# Patient Record
Sex: Female | Born: 1960 | Race: Black or African American | Hispanic: No | Marital: Single | Smoking: Never smoker
Health system: Southern US, Community
[De-identification: ages and names within clinical notes are randomized; demographics above are authoritative.]

## PROBLEM LIST (undated history)

## (undated) DIAGNOSIS — E119 Type 2 diabetes mellitus without complications: Secondary | ICD-10-CM

## (undated) DIAGNOSIS — E785 Hyperlipidemia, unspecified: Secondary | ICD-10-CM

## (undated) DIAGNOSIS — I10 Essential (primary) hypertension: Secondary | ICD-10-CM

## (undated) HISTORY — DX: Hyperlipidemia, unspecified: E78.5

## (undated) HISTORY — DX: Essential (primary) hypertension: I10

## (undated) HISTORY — PX: STOMACH SURGERY: SHX791

---

## 2004-11-15 ENCOUNTER — Emergency Department: Payer: Self-pay | Admitting: Unknown Physician Specialty

## 2006-08-17 ENCOUNTER — Emergency Department: Payer: Self-pay | Admitting: General Practice

## 2007-05-19 ENCOUNTER — Emergency Department: Payer: Self-pay | Admitting: Emergency Medicine

## 2008-05-12 ENCOUNTER — Emergency Department: Payer: Self-pay | Admitting: Emergency Medicine

## 2009-03-21 ENCOUNTER — Emergency Department: Payer: Self-pay | Admitting: Emergency Medicine

## 2009-09-08 ENCOUNTER — Emergency Department: Payer: Self-pay | Admitting: Emergency Medicine

## 2011-10-12 ENCOUNTER — Emergency Department: Payer: Self-pay | Admitting: Emergency Medicine

## 2016-03-27 ENCOUNTER — Other Ambulatory Visit: Payer: Self-pay | Admitting: Family Medicine

## 2016-03-27 DIAGNOSIS — Z01419 Encounter for gynecological examination (general) (routine) without abnormal findings: Secondary | ICD-10-CM

## 2016-06-08 ENCOUNTER — Ambulatory Visit: Payer: Self-pay | Attending: Family Medicine

## 2017-09-17 ENCOUNTER — Other Ambulatory Visit: Payer: Self-pay | Admitting: Internal Medicine

## 2017-09-17 DIAGNOSIS — E1169 Type 2 diabetes mellitus with other specified complication: Secondary | ICD-10-CM | POA: Insufficient documentation

## 2017-09-17 DIAGNOSIS — I152 Hypertension secondary to endocrine disorders: Secondary | ICD-10-CM | POA: Insufficient documentation

## 2017-09-17 DIAGNOSIS — I10 Essential (primary) hypertension: Secondary | ICD-10-CM | POA: Insufficient documentation

## 2017-09-17 DIAGNOSIS — E782 Mixed hyperlipidemia: Secondary | ICD-10-CM

## 2017-09-17 DIAGNOSIS — Z1239 Encounter for other screening for malignant neoplasm of breast: Secondary | ICD-10-CM

## 2018-08-17 ENCOUNTER — Encounter: Payer: Self-pay | Admitting: Nurse Practitioner

## 2018-08-17 DIAGNOSIS — J309 Allergic rhinitis, unspecified: Secondary | ICD-10-CM | POA: Insufficient documentation

## 2018-08-17 DIAGNOSIS — E119 Type 2 diabetes mellitus without complications: Secondary | ICD-10-CM | POA: Insufficient documentation

## 2018-08-17 DIAGNOSIS — R809 Proteinuria, unspecified: Secondary | ICD-10-CM | POA: Insufficient documentation

## 2018-08-18 ENCOUNTER — Ambulatory Visit (INDEPENDENT_AMBULATORY_CARE_PROVIDER_SITE_OTHER): Payer: BLUE CROSS/BLUE SHIELD | Admitting: Nurse Practitioner

## 2018-08-18 ENCOUNTER — Encounter: Payer: Self-pay | Admitting: Nurse Practitioner

## 2018-08-18 VITALS — BP 134/76 | HR 65 | Temp 98.1°F | Ht 64.2 in | Wt 179.0 lb

## 2018-08-18 DIAGNOSIS — E669 Obesity, unspecified: Secondary | ICD-10-CM | POA: Insufficient documentation

## 2018-08-18 DIAGNOSIS — E782 Mixed hyperlipidemia: Secondary | ICD-10-CM

## 2018-08-18 DIAGNOSIS — I10 Essential (primary) hypertension: Secondary | ICD-10-CM | POA: Diagnosis not present

## 2018-08-18 NOTE — Patient Instructions (Signed)
DASH Eating Plan  DASH stands for "Dietary Approaches to Stop Hypertension." The DASH eating plan is a healthy eating plan that has been shown to reduce high blood pressure (hypertension). It may also reduce your risk for type 2 diabetes, heart disease, and stroke. The DASH eating plan may also help with weight loss.  What are tips for following this plan?    General guidelines   Avoid eating more than 2,300 mg (milligrams) of salt (sodium) a day. If you have hypertension, you may need to reduce your sodium intake to 1,500 mg a day.   Limit alcohol intake to no more than 1 drink a day for nonpregnant women and 2 drinks a day for men. One drink equals 12 oz of beer, 5 oz of wine, or 1 oz of hard liquor.   Work with your health care provider to maintain a healthy body weight or to lose weight. Ask what an ideal weight is for you.   Get at least 30 minutes of exercise that causes your heart to beat faster (aerobic exercise) most days of the week. Activities may include walking, swimming, or biking.   Work with your health care provider or diet and nutrition specialist (dietitian) to adjust your eating plan to your individual calorie needs.  Reading food labels     Check food labels for the amount of sodium per serving. Choose foods with less than 5 percent of the Daily Value of sodium. Generally, foods with less than 300 mg of sodium per serving fit into this eating plan.   To find whole grains, look for the word "whole" as the first word in the ingredient list.  Shopping   Buy products labeled as "low-sodium" or "no salt added."   Buy fresh foods. Avoid canned foods and premade or frozen meals.  Cooking   Avoid adding salt when cooking. Use salt-free seasonings or herbs instead of table salt or sea salt. Check with your health care provider or pharmacist before using salt substitutes.   Do not fry foods. Cook foods using healthy methods such as baking, boiling, grilling, and broiling instead.   Cook with  heart-healthy oils, such as olive, canola, soybean, or sunflower oil.  Meal planning   Eat a balanced diet that includes:  ? 5 or more servings of fruits and vegetables each day. At each meal, try to fill half of your plate with fruits and vegetables.  ? Up to 6-8 servings of whole grains each day.  ? Less than 6 oz of lean meat, poultry, or fish each day. A 3-oz serving of meat is about the same size as a deck of cards. One egg equals 1 oz.  ? 2 servings of low-fat dairy each day.  ? A serving of nuts, seeds, or beans 5 times each week.  ? Heart-healthy fats. Healthy fats called Omega-3 fatty acids are found in foods such as flaxseeds and coldwater fish, like sardines, salmon, and mackerel.   Limit how much you eat of the following:  ? Canned or prepackaged foods.  ? Food that is high in trans fat, such as fried foods.  ? Food that is high in saturated fat, such as fatty meat.  ? Sweets, desserts, sugary drinks, and other foods with added sugar.  ? Full-fat dairy products.   Do not salt foods before eating.   Try to eat at least 2 vegetarian meals each week.   Eat more home-cooked food and less restaurant, buffet, and fast food.     When eating at a restaurant, ask that your food be prepared with less salt or no salt, if possible.  What foods are recommended?  The items listed may not be a complete list. Talk with your dietitian about what dietary choices are best for you.  Grains  Whole-grain or whole-wheat bread. Whole-grain or whole-wheat pasta. Brown rice. Oatmeal. Quinoa. Bulgur. Whole-grain and low-sodium cereals. Pita bread. Low-fat, low-sodium crackers. Whole-wheat flour tortillas.  Vegetables  Fresh or frozen vegetables (raw, steamed, roasted, or grilled). Low-sodium or reduced-sodium tomato and vegetable juice. Low-sodium or reduced-sodium tomato sauce and tomato paste. Low-sodium or reduced-sodium canned vegetables.  Fruits  All fresh, dried, or frozen fruit. Canned fruit in natural juice (without  added sugar).  Meat and other protein foods  Skinless chicken or turkey. Ground chicken or turkey. Pork with fat trimmed off. Fish and seafood. Egg whites. Dried beans, peas, or lentils. Unsalted nuts, nut butters, and seeds. Unsalted canned beans. Lean cuts of beef with fat trimmed off. Low-sodium, lean deli meat.  Dairy  Low-fat (1%) or fat-free (skim) milk. Fat-free, low-fat, or reduced-fat cheeses. Nonfat, low-sodium ricotta or cottage cheese. Low-fat or nonfat yogurt. Low-fat, low-sodium cheese.  Fats and oils  Soft margarine without trans fats. Vegetable oil. Low-fat, reduced-fat, or light mayonnaise and salad dressings (reduced-sodium). Canola, safflower, olive, soybean, and sunflower oils. Avocado.  Seasoning and other foods  Herbs. Spices. Seasoning mixes without salt. Unsalted popcorn and pretzels. Fat-free sweets.  What foods are not recommended?  The items listed may not be a complete list. Talk with your dietitian about what dietary choices are best for you.  Grains  Baked goods made with fat, such as croissants, muffins, or some breads. Dry pasta or rice meal packs.  Vegetables  Creamed or fried vegetables. Vegetables in a cheese sauce. Regular canned vegetables (not low-sodium or reduced-sodium). Regular canned tomato sauce and paste (not low-sodium or reduced-sodium). Regular tomato and vegetable juice (not low-sodium or reduced-sodium). Pickles. Olives.  Fruits  Canned fruit in a light or heavy syrup. Fried fruit. Fruit in cream or butter sauce.  Meat and other protein foods  Fatty cuts of meat. Ribs. Fried meat. Bacon. Sausage. Bologna and other processed lunch meats. Salami. Fatback. Hotdogs. Bratwurst. Salted nuts and seeds. Canned beans with added salt. Canned or smoked fish. Whole eggs or egg yolks. Chicken or turkey with skin.  Dairy  Whole or 2% milk, cream, and half-and-half. Whole or full-fat cream cheese. Whole-fat or sweetened yogurt. Full-fat cheese. Nondairy creamers. Whipped toppings.  Processed cheese and cheese spreads.  Fats and oils  Butter. Stick margarine. Lard. Shortening. Ghee. Bacon fat. Tropical oils, such as coconut, palm kernel, or palm oil.  Seasoning and other foods  Salted popcorn and pretzels. Onion salt, garlic salt, seasoned salt, table salt, and sea salt. Worcestershire sauce. Tartar sauce. Barbecue sauce. Teriyaki sauce. Soy sauce, including reduced-sodium. Steak sauce. Canned and packaged gravies. Fish sauce. Oyster sauce. Cocktail sauce. Horseradish that you find on the shelf. Ketchup. Mustard. Meat flavorings and tenderizers. Bouillon cubes. Hot sauce and Tabasco sauce. Premade or packaged marinades. Premade or packaged taco seasonings. Relishes. Regular salad dressings.  Where to find more information:   National Heart, Lung, and Blood Institute: www.nhlbi.nih.gov   American Heart Association: www.heart.org  Summary   The DASH eating plan is a healthy eating plan that has been shown to reduce high blood pressure (hypertension). It may also reduce your risk for type 2 diabetes, heart disease, and stroke.   With the   DASH eating plan, you should limit salt (sodium) intake to 2,300 mg a day. If you have hypertension, you may need to reduce your sodium intake to 1,500 mg a day.   When on the DASH eating plan, aim to eat more fresh fruits and vegetables, whole grains, lean proteins, low-fat dairy, and heart-healthy fats.   Work with your health care provider or diet and nutrition specialist (dietitian) to adjust your eating plan to your individual calorie needs.  This information is not intended to replace advice given to you by your health care provider. Make sure you discuss any questions you have with your health care provider.  Document Released: 07/09/2011 Document Revised: 07/13/2016 Document Reviewed: 07/13/2016  Elsevier Interactive Patient Education  2019 Elsevier Inc.

## 2018-08-18 NOTE — Assessment & Plan Note (Signed)
Provided information on DASH diet.  Recommend focus on diet and regular exercise with 30 minutes 5 days a week.

## 2018-08-18 NOTE — Assessment & Plan Note (Signed)
Chronic, ongoing.  Continue Crestor.  Labs at physical.

## 2018-08-18 NOTE — Progress Notes (Signed)
New Patient Office Visit  Subjective:  Patient ID: Julia Wood, female    DOB: 10-13-1960  Age: 58 y.o. MRN: [id number]  CC:  Chief Complaint  Patient presents with  . Establish Care  . Hypertension    pt states she would like to discuss getting on one blood pressure medication instead of 2     HPI Julia Wood presents for new patient visit to establish care.  Introduced to Designer, jewellery role and practice setting.  All questions answered.  HYPERTENSION / HYPERLIPIDEMIA Satisfied with current treatment? yes Duration of hypertension: chronic BP monitoring frequency: rarely BP range: 130/70's BP medication side effects: no Duration of hyperlipidemia: chronic Cholesterol medication side effects: no Cholesterol supplements: none Medication compliance: good compliance Aspirin: yes Recent stressors: no Recurrent headaches: no Visual changes: no Palpitations: no Dyspnea: no Chest pain: no Lower extremity edema: no Dizzy/lightheaded: no  Past Medical History:  Diagnosis Date  . Hyperlipidemia   . Hypertension     Past Surgical History:  Procedure Laterality Date  . STOMACH SURGERY      Family History  Problem Relation Age of Onset  . Hypertension Mother     Social History   Socioeconomic History  . Marital status: Single    Spouse name: Not on file  . Number of children: Not on file  . Years of education: Not on file  . Highest education level: Not on file  Occupational History  . Not on file  Social Needs  . Financial resource strain: Somewhat hard  . Food insecurity:    Worry: Sometimes true    Inability: Sometimes true  . Transportation needs:    Medical: No    Non-medical: No  Tobacco Use  . Smoking status: Never Smoker  . Smokeless tobacco: Never Used  Substance and Sexual Activity  . Alcohol use: Never    Frequency: Never  . Drug use: Never  . Sexual activity: Not Currently  Lifestyle  . Physical activity:    Days per week:  3 days    Minutes per session: 20 min  . Stress: Not at all  Relationships  . Social connections:    Talks on phone: More than three times a week    Gets together: More than three times a week    Attends religious service: 1 to 4 times per year    Active member of club or organization: No    Attends meetings of clubs or organizations: Never    Relationship status: Not on file  . Intimate partner violence:    Fear of current or ex partner: No    Emotionally abused: No    Physically abused: No    Forced sexual activity: No  Other Topics Concern  . Not on file  Social History Narrative  . Not on file    ROS Review of Systems  Constitutional: Negative for activity change, appetite change, diaphoresis, fatigue and fever.  Respiratory: Negative for cough, chest tightness and shortness of breath.   Cardiovascular: Negative for chest pain, palpitations and leg swelling.  Gastrointestinal: Negative for abdominal distention, abdominal pain, constipation, diarrhea, nausea and vomiting.  Endocrine: Negative for cold intolerance, heat intolerance, polydipsia, polyphagia and polyuria.  Neurological: Negative for dizziness, syncope, weakness, light-headedness, numbness and headaches.  Psychiatric/Behavioral: Negative.     Objective:   Today's Vitals: BP 134/76   Pulse 65   Temp 98.1 F (36.7 C) (Oral)   Ht 5' 4.2" (1.631 m)   Wt 179  lb (81.2 kg)   LMP  (LMP Unknown)   SpO2 100%   BMI 30.53 kg/m   Physical Exam Vitals signs and nursing note reviewed.  Constitutional:      General: She is awake.     Appearance: She is well-developed.  HENT:     Head: Normocephalic.     Right Ear: Hearing normal.     Left Ear: Hearing normal.     Nose: Nose normal.     Mouth/Throat:     Mouth: Mucous membranes are moist.  Eyes:     General: Lids are normal.        Right eye: No discharge.        Left eye: No discharge.     Conjunctiva/sclera: Conjunctivae normal.     Pupils: Pupils are  equal, round, and reactive to light.  Neck:     Musculoskeletal: Normal range of motion and neck supple.     Thyroid: No thyromegaly.     Vascular: No carotid bruit or JVD.  Cardiovascular:     Rate and Rhythm: Normal rate and regular rhythm.     Heart sounds: Normal heart sounds. No murmur. No gallop.   Pulmonary:     Effort: Pulmonary effort is normal.     Breath sounds: Normal breath sounds.  Abdominal:     General: Bowel sounds are normal.     Palpations: Abdomen is soft. There is no hepatomegaly or splenomegaly.  Musculoskeletal:     Right lower leg: No edema.     Left lower leg: No edema.  Lymphadenopathy:     Cervical: No cervical adenopathy.  Skin:    General: Skin is warm and dry.  Neurological:     Mental Status: She is alert and oriented to person, place, and time.  Psychiatric:        Attention and Perception: Attention normal.        Mood and Affect: Mood normal.        Behavior: Behavior normal. Behavior is cooperative.        Thought Content: Thought content normal.        Judgment: Judgment normal.     Assessment & Plan:   Problem List Items Addressed This Visit      Cardiovascular and Mediastinum   Essential hypertension - Primary    Chronic, stable with BP 134/76 today.  Patient wishes to decrease to one medication.  Recommended DASH diet and focus on exercise, if continued below goal medications will trial reduction of medications.  Labs at next visit.      Relevant Medications   amLODipine (NORVASC) 5 MG tablet   hydrochlorothiazide (HYDRODIURIL) 25 MG tablet   rosuvastatin (CRESTOR) 10 MG tablet   aspirin EC 81 MG tablet     Other   Hyperlipidemia, mixed    Chronic, ongoing.  Continue Crestor.  Labs at physical.      Relevant Medications   amLODipine (NORVASC) 5 MG tablet   hydrochlorothiazide (HYDRODIURIL) 25 MG tablet   rosuvastatin (CRESTOR) 10 MG tablet   aspirin EC 81 MG tablet   Obesity (BMI 30-39.9)    Provided information on DASH  diet.  Recommend focus on diet and regular exercise with 30 minutes 5 days a week.         Outpatient Encounter Medications as of 08/18/2018  Medication Sig  . amLODipine (NORVASC) 5 MG tablet Take 1 tablet by mouth daily.  Marland Kitchen aspirin EC 81 MG tablet Take 81 mg by mouth  daily.  . hydrochlorothiazide (HYDRODIURIL) 25 MG tablet Take 1 tablet by mouth daily.  . rosuvastatin (CRESTOR) 10 MG tablet TAKE 1 TABLET BY MOUTH ONCE DAILY   No facility-administered encounter medications on file as of 08/18/2018.     Follow-up: Return in about 4 weeks (around 09/15/2018) for Physical exam with pap.   Venita Lick, NP

## 2018-08-18 NOTE — Assessment & Plan Note (Signed)
Chronic, stable with BP 134/76 today.  Patient wishes to decrease to one medication.  Recommended DASH diet and focus on exercise, if continued below goal medications will trial reduction of medications.  Labs at next visit.

## 2018-09-02 ENCOUNTER — Other Ambulatory Visit: Payer: Self-pay | Admitting: Nurse Practitioner

## 2018-09-02 MED ORDER — ROSUVASTATIN CALCIUM 10 MG PO TABS: 10.0000 mg | ORAL_TABLET | Freq: Every day | ORAL | 1 refills | Status: DC

## 2018-09-02 NOTE — Telephone Encounter (Signed)
Copied from [place] [id number]. Topic: Quick Communication - Rx Refill/Question >> Sep 02, 2018  2:39 PM Windy Kalata wrote: Medication: rosuvastatin (CRESTOR) 10 MG tablet  Has the patient contacted their pharmacy? Yes.   (Agent: If no, request that the patient contact the pharmacy for the refill.) (Agent: If yes, when and what did the pharmacy advise?) Call office  Preferred Pharmacy (with phone number or street name): [facility] [place] [phone] (Phone) [phone] (Fax)    Agent: Please be advised that RX refills may take up to 3 business days. We ask that you follow-up with your pharmacy.

## 2018-09-02 NOTE — Telephone Encounter (Signed)
Refill for Crestor provided.

## 2018-09-02 NOTE — Telephone Encounter (Signed)
Requested medication (s) are due for refill today: not specified  Requested medication (s) are on the active medication list: yes    Last refill: 08/18/2018  Future visit scheduled    Yes    09/23/2018  Notes to clinic:  historical provider  Requested Prescriptions  Pending Prescriptions Disp Refills   rosuvastatin (CRESTOR) 10 MG tablet      Sig: Take 1 tablet (10 mg total) by mouth daily.     Cardiovascular:  Antilipid - Statins Failed - 09/02/2018  2:41 PM      Failed - Total Cholesterol in normal range and within 360 days    No results found for: CHOL, POCCHOL       Failed - LDL in normal range and within 360 days    No results found for: LDLCALC, LDLC, HIRISKLDL       Failed - HDL in normal range and within 360 days    No results found for: HDL       Failed - Triglycerides in normal range and within 360 days    No results found for: TRIG       Passed - Patient is not pregnant      Passed - Valid encounter within last 12 months    Recent Outpatient Visits          2 weeks ago Essential hypertension   [place], Barbaraann Faster, NP      Future Appointments            In 3 weeks Cannady, Barbaraann Faster, NP [facility]

## 2018-09-10 ENCOUNTER — Encounter: Payer: Self-pay | Admitting: Intensive Care

## 2018-09-10 ENCOUNTER — Emergency Department
Admission: EM | Admit: 2018-09-10 | Discharge: 2018-09-10 | Disposition: A | Discharge status: Home / Self Care | Payer: BLUE CROSS/BLUE SHIELD | Attending: Emergency Medicine | Admitting: Emergency Medicine

## 2018-09-10 ENCOUNTER — Other Ambulatory Visit: Payer: Self-pay

## 2018-09-10 DIAGNOSIS — J111 Influenza due to unidentified influenza virus with other respiratory manifestations: Secondary | ICD-10-CM | POA: Insufficient documentation

## 2018-09-10 DIAGNOSIS — I10 Essential (primary) hypertension: Secondary | ICD-10-CM | POA: Insufficient documentation

## 2018-09-10 DIAGNOSIS — Z79899 Other long term (current) drug therapy: Secondary | ICD-10-CM | POA: Insufficient documentation

## 2018-09-10 DIAGNOSIS — E785 Hyperlipidemia, unspecified: Secondary | ICD-10-CM | POA: Insufficient documentation

## 2018-09-10 DIAGNOSIS — R6883 Chills (without fever): Secondary | ICD-10-CM | POA: Diagnosis present

## 2018-09-10 MED ORDER — ONDANSETRON HCL 4 MG PO TABS: 4.0000 mg | ORAL_TABLET | Freq: Every day | ORAL | 0 refills | Status: DC | PRN

## 2018-09-10 MED ORDER — ACETAMINOPHEN 325 MG PO TABS
650.0000 mg | ORAL_TABLET | Freq: Once | ORAL | Status: AC | PRN
  Administered 2018-09-10: 650 mg via ORAL
  Filled 2018-09-10: qty 2

## 2018-09-10 MED ORDER — ACETAMINOPHEN 500 MG PO TABS: 500.0000 mg | ORAL_TABLET | Freq: Four times a day (QID) | ORAL | 0 refills | Status: DC | PRN

## 2018-09-10 MED ORDER — BENZONATATE 100 MG PO CAPS: 100.0000 mg | ORAL_CAPSULE | Freq: Three times a day (TID) | ORAL | 0 refills | Status: DC | PRN

## 2018-09-10 MED ORDER — IBUPROFEN 600 MG PO TABS: 600.0000 mg | ORAL_TABLET | Freq: Four times a day (QID) | ORAL | 0 refills | Status: DC | PRN

## 2018-09-10 NOTE — ED Triage Notes (Addendum)
Patient c/o fever, body aches, chills/sweats, and cough. Denies taking any medicine for fever at home. States "I think I caught the flu from my son"

## 2018-09-10 NOTE — ED Provider Notes (Signed)
[facility] Emergency Department Provider Note  ____________________________________________  Time seen: Approximately 3:45 PM  I have reviewed the triage vital signs and the nursing notes.   HISTORY  Chief Complaint Influenza    HPI Julia Wood is a 58 y.o. female that presents emergency department for evaluation of chills, nasal congestion, cough, vomiting x2, body aches for 1 day.  Patient states the back pain is the worst symptom.  Patient states that she has been around her son, who was diagnosed with the flu.  Patient does not smoke.  No shortness of breath, chest pain, abdominal pain.  Past Medical History:  Diagnosis Date  . Hyperlipidemia   . Hypertension     Patient Active Problem List   Diagnosis Date Noted  . Obesity (BMI 30-39.9) 08/18/2018  . Allergic rhinitis 08/17/2018  . IFG (impaired fasting glucose) 08/17/2018  . Essential hypertension 09/17/2017  . Hyperlipidemia, mixed 09/17/2017    Past Surgical History:  Procedure Laterality Date  . STOMACH SURGERY      Prior to Admission medications   Medication Sig Start Date End Date Taking? Authorizing Provider  acetaminophen (TYLENOL) 500 MG tablet Take 1 tablet (500 mg total) by mouth every 6 (six) hours as needed. 09/10/18   Laban Emperor, PA-C  amLODipine (NORVASC) 5 MG tablet Take 1 tablet by mouth daily. 08/02/18 08/02/19  [provider]  aspirin EC 81 MG tablet Take 81 mg by mouth daily.    [provider]  benzonatate (TESSALON PERLES) 100 MG capsule Take 1 capsule (100 mg total) by mouth 3 (three) times daily as needed for cough. 09/10/18 09/10/19  Laban Emperor, PA-C  hydrochlorothiazide (HYDRODIURIL) 25 MG tablet Take 1 tablet by mouth daily. 07/11/18 07/11/19  [provider]  ibuprofen (ADVIL,MOTRIN) 600 MG tablet Take 1 tablet (600 mg total) by mouth every 6 (six) hours as needed. 09/10/18   Laban Emperor, PA-C  ondansetron (ZOFRAN) 4 MG tablet Take  1 tablet (4 mg total) by mouth daily as needed for nausea or vomiting. 09/10/18 09/10/19  Laban Emperor, PA-C  rosuvastatin (CRESTOR) 10 MG tablet Take 1 tablet (10 mg total) by mouth daily. 09/02/18   Venita Lick, NP    Allergies Patient has no known allergies.  Family History  Problem Relation Age of Onset  . Hypertension Mother     Social History Social History   Tobacco Use  . Smoking status: Never Smoker  . Smokeless tobacco: Never Used  Substance Use Topics  . Alcohol use: Never    Frequency: Never  . Drug use: Never     Review of Systems  Constitutional: No fever/chills Eyes: No visual changes. No discharge. ENT: Positive for congestion and rhinorrhea. Cardiovascular: No chest pain. Respiratory: Positive for cough. No SOB. Gastrointestinal: No abdominal pain.  Positive for vomiting x2.  No diarrhea.  No constipation. Musculoskeletal: Positive for body aches. Skin: Negative for rash, abrasions, lacerations, ecchymosis. Neurological: Negative for headaches.   ____________________________________________   PHYSICAL EXAM:  VITAL SIGNS: ED Triage Vitals  Enc Vitals Group     BP 09/10/18 1315 (!) 148/80     Pulse Rate 09/10/18 1315 97     Resp 09/10/18 1315 16     Temp 09/10/18 1315 (!) 101.1 F (38.4 C)     Temp Source 09/10/18 1315 Oral     SpO2 09/10/18 1315 96 %     Weight 09/10/18 1316 186 lb (84.4 kg)     Height 09/10/18 1316 5'  3" (1.6 m)     Head Circumference --      Peak Flow --      Pain Score 09/10/18 1316 9     Pain Loc --      Pain Edu? --      Excl. in [place]? --      Constitutional: Alert and oriented. Well appearing and in no acute distress. Eyes: Conjunctivae are normal. PERRL. EOMI. No discharge. Head: Atraumatic. ENT: No frontal and maxillary sinus tenderness.      Ears: Tympanic membranes pearly gray with good landmarks. No discharge.      Nose: Mild congestion/rhinnorhea.      Mouth/Throat: Mucous membranes are moist.  Oropharynx non-erythematous. Tonsils not enlarged. No exudates. Uvula midline. Neck: No stridor.   Hematological/Lymphatic/Immunilogical: No cervical lymphadenopathy. Cardiovascular: Normal rate, regular rhythm.  Good peripheral circulation. Respiratory: Normal respiratory effort without tachypnea or retractions. Lungs CTAB. Good air entry to the bases with no decreased or absent breath sounds. Gastrointestinal: Bowel sounds 4 quadrants. Soft and nontender to palpation. No guarding or rigidity. No palpable masses. No distention. Musculoskeletal: Full range of motion to all extremities. No gross deformities appreciated. Neurologic:  Normal speech and language. No gross focal neurologic deficits are appreciated.  Skin:  Skin is warm, dry and intact. No rash noted. Psychiatric: Mood and affect are normal. Speech and behavior are normal. Patient exhibits appropriate insight and judgement.   ____________________________________________   LABS (all labs ordered are listed, but only abnormal results are displayed)  Labs Reviewed - No data to display ____________________________________________  EKG   ____________________________________________  RADIOLOGY   No results found.  ____________________________________________    PROCEDURES  Procedure(s) performed:    Procedures    Medications  acetaminophen (TYLENOL) tablet 650 mg (650 mg Oral Given 09/10/18 1320)     ____________________________________________   INITIAL IMPRESSION / ASSESSMENT AND PLAN / ED COURSE  Pertinent labs & imaging results that were available during my care of the patient were reviewed by me and considered in my medical decision making (see chart for details).  Review of the [place] CSRS was performed in accordance of the [place] prior to dispensing any controlled drugs.     Patient's diagnosis is consistent with influenza. Vital signs and exam are reassuring. Patient appears well and is staying well  hydrated. Patient should alternate tylenol and ibuprofen for fever. Patient feels comfortable going home. Patient will be discharged home with prescriptions for Tamiflu, Tessalon Perles, Zofran, Tylenol, Motrin. Patient is to follow up with primary care as needed or otherwise directed. Patient is given ED precautions to return to the ED for any worsening or new symptoms.     ____________________________________________  FINAL CLINICAL IMPRESSION(S) / ED DIAGNOSES  Final diagnoses:  Influenza      NEW MEDICATIONS STARTED DURING THIS VISIT:  ED Discharge Orders         Ordered    acetaminophen (TYLENOL) 500 MG tablet  Every 6 hours PRN     09/10/18 1548    ibuprofen (ADVIL,MOTRIN) 600 MG tablet  Every 6 hours PRN     09/10/18 1548    benzonatate (TESSALON PERLES) 100 MG capsule  3 times daily PRN     09/10/18 1548    ondansetron (ZOFRAN) 4 MG tablet  Daily PRN     09/10/18 1548              This chart was dictated using voice recognition software/Dragon. Despite best efforts to proofread, errors can occur  which can change the meaning. Any change was purely unintentional.    Laban Emperor, PA-C 09/10/18 [place], [place], MD 09/10/18 2002

## 2018-09-23 ENCOUNTER — Other Ambulatory Visit: Payer: Self-pay

## 2018-09-23 ENCOUNTER — Ambulatory Visit (INDEPENDENT_AMBULATORY_CARE_PROVIDER_SITE_OTHER): Payer: BLUE CROSS/BLUE SHIELD | Admitting: Nurse Practitioner

## 2018-09-23 ENCOUNTER — Telehealth: Payer: Self-pay

## 2018-09-23 ENCOUNTER — Telehealth: Payer: Self-pay | Admitting: Gastroenterology

## 2018-09-23 ENCOUNTER — Encounter: Payer: Self-pay | Admitting: Nurse Practitioner

## 2018-09-23 ENCOUNTER — Other Ambulatory Visit (HOSPITAL_COMMUNITY)
Admission: RE | Admit: 2018-09-23 | Discharge: 2018-09-23 | Disposition: A | Discharge status: Home / Self Care | Payer: BLUE CROSS/BLUE SHIELD | Source: Ambulatory Visit | Attending: Nurse Practitioner | Admitting: Nurse Practitioner

## 2018-09-23 VITALS — BP 128/74 | HR 78 | Temp 98.2°F | Ht 63.0 in | Wt 178.9 lb

## 2018-09-23 DIAGNOSIS — Z0000 Encounter for general adult medical examination without abnormal findings: Secondary | ICD-10-CM

## 2018-09-23 DIAGNOSIS — E669 Obesity, unspecified: Secondary | ICD-10-CM

## 2018-09-23 DIAGNOSIS — J301 Allergic rhinitis due to pollen: Secondary | ICD-10-CM

## 2018-09-23 DIAGNOSIS — Z1211 Encounter for screening for malignant neoplasm of colon: Secondary | ICD-10-CM

## 2018-09-23 DIAGNOSIS — R7301 Impaired fasting glucose: Secondary | ICD-10-CM

## 2018-09-23 DIAGNOSIS — E782 Mixed hyperlipidemia: Secondary | ICD-10-CM | POA: Diagnosis not present

## 2018-09-23 DIAGNOSIS — I10 Essential (primary) hypertension: Secondary | ICD-10-CM

## 2018-09-23 DIAGNOSIS — Z1329 Encounter for screening for other suspected endocrine disorder: Secondary | ICD-10-CM

## 2018-09-23 DIAGNOSIS — Z1159 Encounter for screening for other viral diseases: Secondary | ICD-10-CM

## 2018-09-23 DIAGNOSIS — Z114 Encounter for screening for human immunodeficiency virus [HIV]: Secondary | ICD-10-CM

## 2018-09-23 DIAGNOSIS — Z1239 Encounter for other screening for malignant neoplasm of breast: Secondary | ICD-10-CM

## 2018-09-23 MED ORDER — NA SULFATE-K SULFATE-MG SULF 17.5-3.13-1.6 GM/177ML PO SOLN: 1.0000 | Freq: Once | ORAL | 0 refills | Status: DC

## 2018-09-23 MED ORDER — PEG 3350-KCL-NA BICARB-NACL 420 G PO SOLR: 4000.0000 mL | Freq: Once | ORAL | 0 refills | Status: AC

## 2018-09-23 MED ORDER — HYDROCOD POLST-CPM POLST ER 10-8 MG/5ML PO SUER: 5.0000 mL | Freq: Two times a day (BID) | ORAL | 0 refills | Status: DC | PRN

## 2018-09-23 NOTE — Telephone Encounter (Signed)
Patient called & is currently at [facility] on [address]. Please call in cheaper prep

## 2018-09-23 NOTE — Assessment & Plan Note (Addendum)
Chronic, ongoing.  Continue current medication regimen.  BP initially elevated today with repeat below goal.  Recommend continue focus on DASH diet and exercise, as she wishes to decrease medications.  CBC and CMP today.

## 2018-09-23 NOTE — Assessment & Plan Note (Signed)
Has ongoing increase in symptoms post-flu.  Script for Tussionex to take at night for cough.  Recommend [place] of Xyzal over the counter (she has tried Claritin, Zyrtec, and Allegra with no success).  Consider Singulair if ongoing symptoms.

## 2018-09-23 NOTE — Assessment & Plan Note (Signed)
Check A1C today

## 2018-09-23 NOTE — Patient Instructions (Signed)
DASH Eating Plan  DASH stands for "Dietary Approaches to Stop Hypertension." The DASH eating plan is a healthy eating plan that has been shown to reduce high blood pressure (hypertension). It may also reduce your risk for type 2 diabetes, heart disease, and stroke. The DASH eating plan may also help with weight loss.  What are tips for following this plan?    General guidelines   Avoid eating more than 2,300 mg (milligrams) of salt (sodium) a day. If you have hypertension, you may need to reduce your sodium intake to 1,500 mg a day.   Limit alcohol intake to no more than 1 drink a day for nonpregnant women and 2 drinks a day for men. One drink equals 12 oz of beer, 5 oz of wine, or 1 oz of hard liquor.   Work with your health care provider to maintain a healthy body weight or to lose weight. Ask what an ideal weight is for you.   Get at least 30 minutes of exercise that causes your heart to beat faster (aerobic exercise) most days of the week. Activities may include walking, swimming, or biking.   Work with your health care provider or diet and nutrition specialist (dietitian) to adjust your eating plan to your individual calorie needs.  Reading food labels     Check food labels for the amount of sodium per serving. Choose foods with less than 5 percent of the Daily Value of sodium. Generally, foods with less than 300 mg of sodium per serving fit into this eating plan.   To find whole grains, look for the word "whole" as the first word in the ingredient list.  Shopping   Buy products labeled as "low-sodium" or "no salt added."   Buy fresh foods. Avoid canned foods and premade or frozen meals.  Cooking   Avoid adding salt when cooking. Use salt-free seasonings or herbs instead of table salt or sea salt. Check with your health care provider or pharmacist before using salt substitutes.   Do not fry foods. Cook foods using healthy methods such as baking, boiling, grilling, and broiling instead.   Cook with  heart-healthy oils, such as olive, canola, soybean, or sunflower oil.  Meal planning   Eat a balanced diet that includes:  ? 5 or more servings of fruits and vegetables each day. At each meal, try to fill half of your plate with fruits and vegetables.  ? Up to 6-8 servings of whole grains each day.  ? Less than 6 oz of lean meat, poultry, or fish each day. A 3-oz serving of meat is about the same size as a deck of cards. One egg equals 1 oz.  ? 2 servings of low-fat dairy each day.  ? A serving of nuts, seeds, or beans 5 times each week.  ? Heart-healthy fats. Healthy fats called Omega-3 fatty acids are found in foods such as flaxseeds and coldwater fish, like sardines, salmon, and mackerel.   Limit how much you eat of the following:  ? Canned or prepackaged foods.  ? Food that is high in trans fat, such as fried foods.  ? Food that is high in saturated fat, such as fatty meat.  ? Sweets, desserts, sugary drinks, and other foods with added sugar.  ? Full-fat dairy products.   Do not salt foods before eating.   Try to eat at least 2 vegetarian meals each week.   Eat more home-cooked food and less restaurant, buffet, and fast food.     When eating at a restaurant, ask that your food be prepared with less salt or no salt, if possible.  What foods are recommended?  The items listed may not be a complete list. Talk with your dietitian about what dietary choices are best for you.  Grains  Whole-grain or whole-wheat bread. Whole-grain or whole-wheat pasta. Brown rice. Oatmeal. Quinoa. Bulgur. Whole-grain and low-sodium cereals. Pita bread. Low-fat, low-sodium crackers. Whole-wheat flour tortillas.  Vegetables  Fresh or frozen vegetables (raw, steamed, roasted, or grilled). Low-sodium or reduced-sodium tomato and vegetable juice. Low-sodium or reduced-sodium tomato sauce and tomato paste. Low-sodium or reduced-sodium canned vegetables.  Fruits  All fresh, dried, or frozen fruit. Canned fruit in natural juice (without  added sugar).  Meat and other protein foods  Skinless chicken or turkey. Ground chicken or turkey. Pork with fat trimmed off. Fish and seafood. Egg whites. Dried beans, peas, or lentils. Unsalted nuts, nut butters, and seeds. Unsalted canned beans. Lean cuts of beef with fat trimmed off. Low-sodium, lean deli meat.  Dairy  Low-fat (1%) or fat-free (skim) milk. Fat-free, low-fat, or reduced-fat cheeses. Nonfat, low-sodium ricotta or cottage cheese. Low-fat or nonfat yogurt. Low-fat, low-sodium cheese.  Fats and oils  Soft margarine without trans fats. Vegetable oil. Low-fat, reduced-fat, or light mayonnaise and salad dressings (reduced-sodium). Canola, safflower, olive, soybean, and sunflower oils. Avocado.  Seasoning and other foods  Herbs. Spices. Seasoning mixes without salt. Unsalted popcorn and pretzels. Fat-free sweets.  What foods are not recommended?  The items listed may not be a complete list. Talk with your dietitian about what dietary choices are best for you.  Grains  Baked goods made with fat, such as croissants, muffins, or some breads. Dry pasta or rice meal packs.  Vegetables  Creamed or fried vegetables. Vegetables in a cheese sauce. Regular canned vegetables (not low-sodium or reduced-sodium). Regular canned tomato sauce and paste (not low-sodium or reduced-sodium). Regular tomato and vegetable juice (not low-sodium or reduced-sodium). Pickles. Olives.  Fruits  Canned fruit in a light or heavy syrup. Fried fruit. Fruit in cream or butter sauce.  Meat and other protein foods  Fatty cuts of meat. Ribs. Fried meat. Bacon. Sausage. Bologna and other processed lunch meats. Salami. Fatback. Hotdogs. Bratwurst. Salted nuts and seeds. Canned beans with added salt. Canned or smoked fish. Whole eggs or egg yolks. Chicken or turkey with skin.  Dairy  Whole or 2% milk, cream, and half-and-half. Whole or full-fat cream cheese. Whole-fat or sweetened yogurt. Full-fat cheese. Nondairy creamers. Whipped toppings.  Processed cheese and cheese spreads.  Fats and oils  Butter. Stick margarine. Lard. Shortening. Ghee. Bacon fat. Tropical oils, such as coconut, palm kernel, or palm oil.  Seasoning and other foods  Salted popcorn and pretzels. Onion salt, garlic salt, seasoned salt, table salt, and sea salt. Worcestershire sauce. Tartar sauce. Barbecue sauce. Teriyaki sauce. Soy sauce, including reduced-sodium. Steak sauce. Canned and packaged gravies. Fish sauce. Oyster sauce. Cocktail sauce. Horseradish that you find on the shelf. Ketchup. Mustard. Meat flavorings and tenderizers. Bouillon cubes. Hot sauce and Tabasco sauce. Premade or packaged marinades. Premade or packaged taco seasonings. Relishes. Regular salad dressings.  Where to find more information:   National Heart, Lung, and Blood Institute: www.nhlbi.nih.gov   American Heart Association: www.heart.org  Summary   The DASH eating plan is a healthy eating plan that has been shown to reduce high blood pressure (hypertension). It may also reduce your risk for type 2 diabetes, heart disease, and stroke.   With the   DASH eating plan, you should limit salt (sodium) intake to 2,300 mg a day. If you have hypertension, you may need to reduce your sodium intake to 1,500 mg a day.   When on the DASH eating plan, aim to eat more fresh fruits and vegetables, whole grains, lean proteins, low-fat dairy, and heart-healthy fats.   Work with your health care provider or diet and nutrition specialist (dietitian) to adjust your eating plan to your individual calorie needs.  This information is not intended to replace advice given to you by your health care provider. Make sure you discuss any questions you have with your health care provider.  Document Released: 07/09/2011 Document Revised: 07/13/2016 Document Reviewed: 07/13/2016  Elsevier Interactive Patient Education  2019 Elsevier Inc.

## 2018-09-23 NOTE — Telephone Encounter (Signed)
Patient has been scheduled colonoscopy for 09/29/18.  She does not have email.  She has given me the okay to send her rx and colonoscopy instructions to the pharmacy. This has been done.  Thanks [employer]

## 2018-09-23 NOTE — Assessment & Plan Note (Signed)
Recommend DASH diet and regular exercise regimen (30 minutes a day 5 days a week).

## 2018-09-23 NOTE — Assessment & Plan Note (Signed)
Chronic, ongoing.  Continue current medication regimen.  Lipid panel today. 

## 2018-09-23 NOTE — Progress Notes (Addendum)
BP 128/74 (BP Location: Left Arm, Patient Position: Sitting)   Pulse 78   Temp 98.2 F (36.8 C) (Oral)   Ht 5\' 3"  (1.6 m)   Wt 178 lb 14.4 oz (81.1 kg)   LMP  (LMP Unknown)   SpO2 97%   BMI 31.69 kg/m    Subjective:    Patient ID: Julia Wood, female    DOB: 1960-10-25, 58 y.o.   MRN: [id number]  HPI: Julia Wood is a 58 y.o. female presenting on 09/23/2018 for comprehensive medical examination. Current medical complaints include: ongoing cough since flu  She currently lives with: lives with son Menopausal Symptoms: no   HYPERTENSION / HYPERLIPIDEMIA Continues on HCTZ and Amlodipine for HTN + Crestor for HLD. Satisfied with current treatment? yes Duration of hypertension: chronic BP monitoring frequency: rarely BP range: 130/70 range BP medication side effects: no Duration of hyperlipidemia: chronic Cholesterol medication side effects: no Cholesterol supplements: none Medication compliance: good compliance Aspirin: yes Recent stressors: no Recurrent headaches: no Visual changes: no Palpitations: no Dyspnea: no Chest pain: no Lower extremity edema: no Dizzy/lightheaded: no   COUGH Reports ongoing, dry cough since flu.  States "it feels like allergies and my nose is dripping back".  Treated for flu 09/10/2018.  Currently using cough syrup and Tessalon, which helps during day. Duration: weeks Circumstances of initial development of cough: URI Cough severity: mild Cough description: non-productive and dry Aggravating factors:  worse at night Alleviating factors: nothing Status:  fluctuating Treatments attempted: cough syrup Wheezing: no Shortness of breath: no Chest pain: no Chest tightness:no Nasal congestion: no Runny nose: yes Postnasal drip: yes Frequent throat clearing or swallowing: yes Hemoptysis: no Fevers: no Night sweats: no Weight loss: no Heartburn: no Recent foreign travel: no Tuberculosis contacts: no  Depression Screen done today  and results listed below:  Depression screen [facility] 2/9 08/18/2018  Decreased Interest 0  Down, Depressed, Hopeless 0  PHQ - 2 Score 0  Altered sleeping 1  Tired, decreased energy 0  Change in appetite 1  Feeling bad or failure about yourself  0  Trouble concentrating 0  Moving slowly or fidgety/restless 0  Suicidal thoughts 0  PHQ-9 Score 2  Difficult doing work/chores Not difficult at all    The patient does not have a history of falls. I did not complete a risk assessment for falls. A plan of care for falls was not documented.   Past Medical History:  Past Medical History:  Diagnosis Date  . Hyperlipidemia   . Hypertension     Surgical History:  Past Surgical History:  Procedure Laterality Date  . STOMACH SURGERY      Medications:  Current Outpatient Medications on File Prior to Visit  Medication Sig  . acetaminophen (TYLENOL) 500 MG tablet Take 1 tablet (500 mg total) by mouth every 6 (six) hours as needed.  Marland Kitchen amLODipine (NORVASC) 5 MG tablet Take 1 tablet by mouth daily.  Marland Kitchen aspirin EC 81 MG tablet Take 81 mg by mouth daily.  . benzonatate (TESSALON PERLES) 100 MG capsule Take 1 capsule (100 mg total) by mouth 3 (three) times daily as needed for cough.  . hydrochlorothiazide (HYDRODIURIL) 25 MG tablet Take 1 tablet by mouth daily.  Marland Kitchen ibuprofen (ADVIL,MOTRIN) 600 MG tablet Take 1 tablet (600 mg total) by mouth every 6 (six) hours as needed.  . ondansetron (ZOFRAN) 4 MG tablet Take 1 tablet (4 mg total) by mouth daily as needed for nausea or vomiting.  Marland Kitchen  rosuvastatin (CRESTOR) 10 MG tablet Take 1 tablet (10 mg total) by mouth daily.   No current facility-administered medications on file prior to visit.     Allergies:  No Known Allergies  Social History:  Social History   Socioeconomic History  . Marital status: Single    Spouse name: Not on file  . Number of children: Not on file  . Years of education: Not on file  . Highest education level: Not on file    Occupational History  . Not on file  Social Needs  . Financial resource strain: Somewhat hard  . Food insecurity:    Worry: Sometimes true    Inability: Sometimes true  . Transportation needs:    Medical: No    Non-medical: No  Tobacco Use  . Smoking status: Never Smoker  . Smokeless tobacco: Never Used  Substance and Sexual Activity  . Alcohol use: Never    Frequency: Never  . Drug use: Never  . Sexual activity: Not Currently  Lifestyle  . Physical activity:    Days per week: 3 days    Minutes per session: 20 min  . Stress: Not at all  Relationships  . Social connections:    Talks on phone: More than three times a week    Gets together: More than three times a week    Attends religious service: 1 to 4 times per year    Active member of club or organization: No    Attends meetings of clubs or organizations: Never    Relationship status: Not on file  . Intimate partner violence:    Fear of current or ex partner: No    Emotionally abused: No    Physically abused: No    Forced sexual activity: No  Other Topics Concern  . Not on file  Social History Narrative  . Not on file   Social History   Tobacco Use  Smoking Status Never Smoker  Smokeless Tobacco Never Used   Social History   Substance and Sexual Activity  Alcohol Use Never  . Frequency: Never    Family History:  Family History  Problem Relation Age of Onset  . Hypertension Mother     Past medical history, surgical history, medications, allergies, family history and social history reviewed with patient today and changes made to appropriate areas of the chart.   Review of Systems - Negative  All other ROS negative except what is listed above and in the HPI.      Objective:    BP 128/74 (BP Location: Left Arm, Patient Position: Sitting)   Pulse 78   Temp 98.2 F (36.8 C) (Oral)   Ht 5\' 3"  (1.6 m)   Wt 178 lb 14.4 oz (81.1 kg)   LMP  (LMP Unknown)   SpO2 97%   BMI 31.69 kg/m   Wt Readings  from Last 3 Encounters:  09/23/18 178 lb 14.4 oz (81.1 kg)  09/10/18 186 lb (84.4 kg)  08/18/18 179 lb (81.2 kg)    Physical Exam Vitals signs and nursing note reviewed.  Constitutional:      General: She is awake.     Appearance: She is well-developed and overweight.  HENT:     Head: Normocephalic and atraumatic.     Right Ear: Hearing, tympanic membrane, ear canal and external ear normal.     Left Ear: Hearing, tympanic membrane, ear canal and external ear normal.     Nose: Mucosal edema and rhinorrhea present. Rhinorrhea is clear.  Right Sinus: No maxillary sinus tenderness or frontal sinus tenderness.     Left Sinus: No maxillary sinus tenderness or frontal sinus tenderness.     Mouth/Throat:     Mouth: Mucous membranes are moist.     Pharynx: Posterior oropharyngeal erythema (mild with cobblestoning) present. No pharyngeal swelling or oropharyngeal exudate.  Eyes:     General:        Right eye: No discharge.        Left eye: No discharge.     Conjunctiva/sclera: Conjunctivae normal.     Pupils: Pupils are equal, round, and reactive to light.  Neck:     Musculoskeletal: Normal range of motion and neck supple.     Thyroid: No thyromegaly.     Vascular: No carotid bruit or JVD.  Cardiovascular:     Rate and Rhythm: Normal rate and regular rhythm.     Pulses: Normal pulses.     Heart sounds: Normal heart sounds.  Pulmonary:     Effort: Pulmonary effort is normal.     Breath sounds: Normal breath sounds.     Comments: Clear throughout Chest:     Breasts:        Right: Normal. No swelling, bleeding, inverted nipple, mass, nipple discharge or skin change.        Left: Normal. No swelling, bleeding, inverted nipple, mass, nipple discharge or skin change.  Abdominal:     General: Bowel sounds are normal.     Palpations: Abdomen is soft. There is no hepatomegaly or splenomegaly.  Genitourinary:    Exam position: Lithotomy position.     Labia:        Right: No rash.         Left: No rash.      Vagina: Normal.     Cervix: No cervical motion tenderness or discharge.     Uterus: Normal.      Adnexa: Right adnexa normal and left adnexa normal.     Comments: Cervix visualized at 1 o'clock position, slightly anterior. Musculoskeletal: Normal range of motion.  Lymphadenopathy:     Head:     Right side of head: No submental, submandibular, tonsillar or preauricular adenopathy.     Left side of head: No submental, submandibular, tonsillar or preauricular adenopathy.     Cervical: No cervical adenopathy.     Upper Body:     Right upper body: No supraclavicular, axillary or pectoral adenopathy.     Left upper body: No supraclavicular, axillary or pectoral adenopathy.  Skin:    General: Skin is warm and dry.  Neurological:     Mental Status: She is alert and oriented to person, place, and time.     Deep Tendon Reflexes: Reflexes are normal and symmetric.     Reflex Scores:      Brachioradialis reflexes are 2+ on the right side and 2+ on the left side.      Patellar reflexes are 2+ on the right side and 2+ on the left side. Psychiatric:        Attention and Perception: Attention normal.        Mood and Affect: Mood normal.        Speech: Speech normal.        Behavior: Behavior normal. Behavior is cooperative.        Thought Content: Thought content normal.        Judgment: Judgment normal.     No results found for this or any previous visit.  Assessment & Plan:   Problem List Items Addressed This Visit      Cardiovascular and Mediastinum   Essential hypertension    Chronic, ongoing.  Continue current medication regimen.  BP initially elevated today with repeat below goal.  Recommend continue focus on DASH diet and exercise, as she wishes to decrease medications.  CBC and CMP today.      Relevant Orders   CBC with Differential/Platelet   Comprehensive metabolic panel     Respiratory   Allergic rhinitis    Has ongoing increase in symptoms  post-flu.  Script for Tussionex to take at night for cough.  Recommend [place] of Xyzal over the counter (she has tried Claritin, Zyrtec, and Allegra with no success).  Consider Singulair if ongoing symptoms.        Endocrine   IFG (impaired fasting glucose)    Check A1C today.      Relevant Orders   HgB A1c     Other   Hyperlipidemia, mixed    Chronic, ongoing.  Continue current medication regimen.  Lipid panel today.      Relevant Orders   Lipid Panel w/o Chol/HDL Ratio   Obesity (BMI 30-39.9)    Recommend DASH diet and regular exercise regimen (30 minutes a day 5 days a week).       Other Visit Diagnoses    Annual physical exam    -  Primary   Relevant Orders   Cytology - PAP   Thyroid disorder screen       TSH today.   Relevant Orders   TSH   Breast screening       Mammogram ordered   Relevant Orders   MM DIGITAL SCREENING BILATERAL   Colon cancer screening       GI referral ordered.   Relevant Orders   Ambulatory referral to Gastroenterology   Need for hepatitis C screening test       Relevant Orders   Hepatitis C Antibody   Encounter for screening for HIV       Relevant Orders   HIV Antibody (routine testing w rflx)       Follow up plan: Return in about 6 months (around 03/24/2019) for HTN/HLD.   LABORATORY TESTING:  - Pap smear: pap done  IMMUNIZATIONS:   - Tdap: Tetanus vaccination status reviewed: last tetanus booster within 10 years. - Influenza: Refused - Pneumovax: Not applicable - Prevnar: Not applicable - HPV: Not applicable - Zostavax vaccine: Refused  SCREENING: -Mammogram: Ordered today  - Colonoscopy: Ordered today  - Bone Density: Not applicable  -Hearing Test: Not applicable  -Spirometry: Not applicable   PATIENT COUNSELING:   Advised to take 1 mg of folate supplement per day if capable of pregnancy.   Sexuality: Discussed sexually transmitted diseases, partner selection, use of condoms, avoidance of unintended pregnancy  and  contraceptive alternatives.   Advised to avoid cigarette smoking.  I discussed with the patient that most people either abstain from alcohol or drink within safe limits (<=14/week and <=4 drinks/occasion for males, <=7/weeks and <= 3 drinks/occasion for females) and that the risk for alcohol disorders and other health effects rises proportionally with the number of drinks per week and how often a drinker exceeds daily limits.  Discussed cessation/primary prevention of drug use and availability of treatment for abuse.   Diet: Encouraged to adjust caloric intake to maintain  or achieve ideal body weight, to reduce intake of dietary saturated fat and total fat, to limit sodium  intake by avoiding high sodium foods and not adding table salt, and to maintain adequate dietary potassium and calcium preferably from fresh fruits, vegetables, and low-fat dairy products.    stressed the importance of regular exercise  Injury prevention: Discussed safety belts, safety helmets, smoke detector, smoking near bedding or upholstery.   Dental health: Discussed importance of regular tooth brushing, flossing, and dental visits.    NEXT PREVENTATIVE PHYSICAL DUE IN 1 YEAR. Return in about 6 months (around 03/24/2019) for HTN/HLD.  Controlled substance.  Checked data base and no other refills of controlled substances noted.

## 2018-09-24 LAB — CBC WITH DIFFERENTIAL/PLATELET
Basophils Absolute: 0 10*3/uL (ref 0.0–0.2)
Basos: 1 %
EOS (ABSOLUTE): 0 10*3/uL (ref 0.0–0.4)
Eos: 1 %
Hematocrit: 42.5 % (ref 34.0–46.6)
Hemoglobin: 14 g/dL (ref 11.1–15.9)
Immature Grans (Abs): 0 10*3/uL (ref 0.0–0.1)
Immature Granulocytes: 0 %
Lymphocytes Absolute: 1.5 10*3/uL (ref 0.7–3.1)
Lymphs: 34 %
MCH: 27.2 pg (ref 26.6–33.0)
MCHC: 32.9 g/dL (ref 31.5–35.7)
MCV: 83 fL (ref 79–97)
Monocytes Absolute: 0.3 10*3/uL (ref 0.1–0.9)
Monocytes: 7 %
Neutrophils Absolute: 2.6 10*3/uL (ref 1.4–7.0)
Neutrophils: 57 %
Platelets: 383 10*3/uL (ref 150–450)
RBC: 5.15 x10E6/uL (ref 3.77–5.28)
RDW: 13.3 % (ref 11.7–15.4)
WBC: 4.6 10*3/uL (ref 3.4–10.8)

## 2018-09-24 LAB — COMPREHENSIVE METABOLIC PANEL
ALBUMIN: 4.2 g/dL (ref 3.8–4.9)
ALK PHOS: 67 IU/L (ref 39–117)
ALT: 20 IU/L (ref 0–32)
AST: 16 IU/L (ref 0–40)
Albumin/Globulin Ratio: 1.8 (ref 1.2–2.2)
BUN/Creatinine Ratio: 12 (ref 9–23)
BUN: 11 mg/dL (ref 6–24)
Bilirubin Total: 0.2 mg/dL (ref 0.0–1.2)
CO2: 28 mmol/L (ref 20–29)
Calcium: 9.6 mg/dL (ref 8.7–10.2)
Chloride: 98 mmol/L (ref 96–106)
Creatinine, Ser: 0.91 mg/dL (ref 0.57–1.00)
GFR calc Af Amer: 80 mL/min/{1.73_m2} (ref >59)
GFR calc non Af Amer: 69 mL/min/{1.73_m2} (ref >59)
Globulin, Total: 2.3 g/dL (ref 1.5–4.5)
Glucose: 242 mg/dL — ABNORMAL HIGH (ref 65–99)
POTASSIUM: 3.6 mmol/L (ref 3.5–5.2)
SODIUM: 141 mmol/L (ref 134–144)
Total Protein: 6.5 g/dL (ref 6.0–8.5)

## 2018-09-24 LAB — LIPID PANEL W/O CHOL/HDL RATIO
Cholesterol, Total: 120 mg/dL (ref 100–199)
HDL: 29 mg/dL — ABNORMAL LOW (ref >39)
LDL Calculated: 51 mg/dL (ref 0–99)
Triglycerides: 202 mg/dL — ABNORMAL HIGH (ref 0–149)
VLDL Cholesterol Cal: 40 mg/dL (ref 5–40)

## 2018-09-24 LAB — TSH: TSH: 0.724 u[IU]/mL (ref 0.450–4.500)

## 2018-09-24 LAB — HEMOGLOBIN A1C
ESTIMATED AVERAGE GLUCOSE: 160 mg/dL
Hgb A1c MFr Bld: 7.2 % — ABNORMAL HIGH (ref 4.8–5.6)

## 2018-09-24 LAB — HIV ANTIBODY (ROUTINE TESTING W REFLEX): HIV SCREEN 4TH GENERATION: NONREACTIVE

## 2018-09-24 LAB — HEPATITIS C ANTIBODY: Hep C Virus Ab: <0.1 s/co ratio (ref 0.0–0.9)

## 2018-09-26 ENCOUNTER — Encounter: Payer: Self-pay | Admitting: Nurse Practitioner

## 2018-09-26 ENCOUNTER — Telehealth: Payer: Self-pay | Admitting: Nurse Practitioner

## 2018-09-26 LAB — CYTOLOGY - PAP: Diagnosis: NEGATIVE

## 2018-09-26 NOTE — Progress Notes (Signed)
Letter of results sent to patient.

## 2018-09-26 NOTE — Telephone Encounter (Signed)
Attempted x 2 to call patient and review labs + discuss need to start Metformin for diabetes with A1C 7.2%.  Voicemail box not set-up and unable to leave message.

## 2018-09-27 NOTE — Telephone Encounter (Signed)
° ° ° ° °  Pt returned called and would like a call  Back

## 2018-09-29 ENCOUNTER — Encounter
Admission: RE | Disposition: A | Discharge status: Home / Self Care | Payer: Self-pay | Source: Home / Self Care | Attending: Gastroenterology

## 2018-09-29 ENCOUNTER — Ambulatory Visit: Payer: BLUE CROSS/BLUE SHIELD | Admitting: Registered Nurse

## 2018-09-29 ENCOUNTER — Encounter: Payer: Self-pay | Admitting: *Deleted

## 2018-09-29 ENCOUNTER — Ambulatory Visit
Admission: RE | Admit: 2018-09-29 | Discharge: 2018-09-29 | Disposition: A | Discharge status: Home / Self Care | Payer: BLUE CROSS/BLUE SHIELD | Attending: Gastroenterology | Admitting: Gastroenterology

## 2018-09-29 DIAGNOSIS — Z7982 Long term (current) use of aspirin: Secondary | ICD-10-CM | POA: Diagnosis not present

## 2018-09-29 DIAGNOSIS — Z79899 Other long term (current) drug therapy: Secondary | ICD-10-CM | POA: Diagnosis not present

## 2018-09-29 DIAGNOSIS — Z8249 Family history of ischemic heart disease and other diseases of the circulatory system: Secondary | ICD-10-CM | POA: Insufficient documentation

## 2018-09-29 DIAGNOSIS — K5730 Diverticulosis of large intestine without perforation or abscess without bleeding: Secondary | ICD-10-CM

## 2018-09-29 DIAGNOSIS — I10 Essential (primary) hypertension: Secondary | ICD-10-CM | POA: Diagnosis not present

## 2018-09-29 DIAGNOSIS — E119 Type 2 diabetes mellitus without complications: Secondary | ICD-10-CM | POA: Diagnosis not present

## 2018-09-29 DIAGNOSIS — Z791 Long term (current) use of non-steroidal anti-inflammatories (NSAID): Secondary | ICD-10-CM | POA: Insufficient documentation

## 2018-09-29 DIAGNOSIS — E785 Hyperlipidemia, unspecified: Secondary | ICD-10-CM | POA: Insufficient documentation

## 2018-09-29 DIAGNOSIS — D122 Benign neoplasm of ascending colon: Secondary | ICD-10-CM | POA: Diagnosis not present

## 2018-09-29 DIAGNOSIS — Z1211 Encounter for screening for malignant neoplasm of colon: Secondary | ICD-10-CM | POA: Insufficient documentation

## 2018-09-29 HISTORY — PX: COLONOSCOPY WITH PROPOFOL: SHX5780

## 2018-09-29 HISTORY — DX: Type 2 diabetes mellitus without complications: E11.9

## 2018-09-29 LAB — GLUCOSE, CAPILLARY: Glucose-Capillary: 121 mg/dL — ABNORMAL HIGH (ref 70–99)

## 2018-09-29 SURGERY — COLONOSCOPY WITH PROPOFOL
Anesthesia: General

## 2018-09-29 MED ORDER — PROPOFOL 500 MG/50ML IV EMUL
INTRAVENOUS | Status: AC
  Filled 2018-09-29: qty 50

## 2018-09-29 MED ORDER — MIDAZOLAM HCL 2 MG/2ML IJ SOLN
INTRAMUSCULAR | Status: AC
  Filled 2018-09-29: qty 2

## 2018-09-29 MED ORDER — SODIUM CHLORIDE 0.9 % IV SOLN
INTRAVENOUS | Status: DC
  Administered 2018-09-29: 09:00:00 via INTRAVENOUS

## 2018-09-29 MED ORDER — PROPOFOL 10 MG/ML IV BOLUS
INTRAVENOUS | Status: DC | PRN
  Administered 2018-09-29: 60 mg via INTRAVENOUS

## 2018-09-29 MED ORDER — PROPOFOL 500 MG/50ML IV EMUL
INTRAVENOUS | Status: DC | PRN
  Administered 2018-09-29: 150 ug/kg/min via INTRAVENOUS

## 2018-09-29 MED ORDER — MIDAZOLAM HCL 2 MG/2ML IJ SOLN
INTRAMUSCULAR | Status: DC | PRN
  Administered 2018-09-29: 2 mg via INTRAVENOUS

## 2018-09-29 MED ORDER — LIDOCAINE HCL (CARDIAC) PF 100 MG/5ML IV SOSY
PREFILLED_SYRINGE | INTRAVENOUS | Status: DC | PRN
  Administered 2018-09-29: 40 mg via INTRAVENOUS

## 2018-09-29 NOTE — Anesthesia Preprocedure Evaluation (Signed)
Anesthesia Evaluation  Patient identified by MRN, date of birth, ID band Patient awake    Reviewed: Allergy & Precautions, H&P , NPO status , Patient's Chart, lab work & pertinent test results, reviewed documented beta blocker date and time   Airway Mallampati: II   Neck ROM: full    Dental  (+) Poor Dentition   Pulmonary neg pulmonary ROS,    Pulmonary exam normal        Cardiovascular Exercise Tolerance: Good hypertension, negative cardio ROS Normal cardiovascular exam Rhythm:regular Rate:Normal     Neuro/Psych negative neurological ROS  negative psych ROS   GI/Hepatic negative GI ROS, Neg liver ROS,   Endo/Other  negative endocrine ROSdiabetes  Renal/GU negative Renal ROS  negative genitourinary   Musculoskeletal   Abdominal   Peds  Hematology negative hematology ROS (+)   Anesthesia Other Findings Past Medical History: No date: Diabetes mellitus without complication (HCC) No date: Hyperlipidemia No date: Hypertension Past Surgical History: No date: STOMACH SURGERY BMI    Body Mass Index:  31.71 kg/m     Reproductive/Obstetrics negative OB ROS                             Anesthesia Physical Anesthesia Plan  ASA: II  Anesthesia Plan: General   Post-op Pain Management:    Induction:   PONV Risk Score and Plan:   Airway Management Planned:   Additional Equipment:   Intra-op Plan:   Post-operative Plan:   Informed Consent: I have reviewed the patients History and Physical, chart, labs and discussed the procedure including the risks, benefits and alternatives for the proposed anesthesia with the patient or authorized representative who has indicated his/her understanding and acceptance.     Dental Advisory Given  Plan Discussed with: CRNA  Anesthesia Plan Comments:         Anesthesia Quick Evaluation

## 2018-09-29 NOTE — Op Note (Signed)
[facility] Patient Name: Julia Wood Procedure Date: 09/29/2018 8:54 AM MRN: [id number] Account #: [id number] Date of Birth: 05-07-1961 Admit Type: Outpatient Age: 58 Room: [facility] ENDO ROOM 2 Gender: Female Note Status: Finalized Procedure:            Colonoscopy Indications:          Screening for colorectal malignant neoplasm Providers:            Noha Karasik B. Bonna Gains MD, MD Medicines:            Monitored Anesthesia Care Complications:        No immediate complications. Procedure:            Pre-Anesthesia Assessment:                       - ASA Grade Assessment: II - A patient with mild                        systemic disease.                       - Prior to the procedure, a History and Physical was                        performed, and patient medications, allergies and                        sensitivities were reviewed. The patient's tolerance of                        previous anesthesia was reviewed.                       - The risks and benefits of the procedure and the                        sedation options and risks were discussed with the                        patient. All questions were answered and informed                        consent was obtained.                       - Patient identification and proposed procedure were                        verified prior to the procedure by the physician, the                        nurse, the anesthesiologist, the anesthetist and the                        technician. The procedure was verified in the procedure                        room.                       After obtaining informed consent, the colonoscope was  passed under direct vision. Throughout the procedure,                        the patient's blood pressure, pulse, and oxygen                        saturations were monitored continuously. The                        Colonoscope was introduced through the  anus and                        advanced to the the cecum, identified by appendiceal                        orifice and ileocecal valve. The colonoscopy was                        performed with ease. The patient tolerated the                        procedure well. The quality of the bowel preparation                        was fair except the ascending colon was poor and the                        cecum was poor. Findings:      The perianal and digital rectal examinations were normal.      A 6 mm polyp was found in the ascending colon. The polyp was sessile.       Polypectomy was attempted, initially using a cold snare. Polyp resection       was incomplete with this device. This intervention then required a       different device and polypectomy technique. The polyp was removed with a       cold biopsy forceps. Resection and retrieval were complete.      Multiple diverticula were found in the colon.      The exam was otherwise without abnormality.      Retroflexion in the rectum was not performed due to narrow rectum.       Frontal view was normal. Impression:           - One 6 mm polyp in the ascending colon, removed with a                        cold biopsy forceps. Resected and retrieved.                       - Diverticulosis.                       - The examination was otherwise normal.                       - The distal rectum and anal verge are normal on                        retroflexion view. Recommendation:       - Discharge patient to home (with escort).                       -  Advance diet as tolerated.                       - Continue present medications.                       - Await pathology results.                       - Repeat colonoscopy within 6 months with 2 day prep.                       - The findings and recommendations were discussed with                        the patient.                       - The findings and recommendations were discussed with                         the patient's family.                       - Return to primary care physician as previously                        scheduled.                       - High fiber diet. Procedure Code(s):    --- Professional ---                       [phone], Colonoscopy, flexible; with biopsy, single or                        multiple Diagnosis Code(s):    --- Professional ---                       Z12.11, Encounter for screening for malignant neoplasm                        of colon                       D12.2, Benign neoplasm of ascending colon                       K57.30, Diverticulosis of large intestine without                        perforation or abscess without bleeding CPT copyright 2018 American Medical [employer]. All rights reserved. The codes documented in this report are preliminary and upon coder review may  be revised to meet current compliance requirements.  Vonda Antigua, MD Margretta Sidle B. Bonna Gains MD, MD 09/29/2018 10:30:47 AM This report has been signed electronically. Number of Addenda: 0 Note Initiated On: 09/29/2018 8:54 AM Scope Withdrawal Time: 0 hours 19 minutes 45 seconds  Total Procedure Duration: 0 hours 33 minutes 46 seconds  Estimated Blood Loss: Estimated blood loss: none.      [facility]

## 2018-09-29 NOTE — Anesthesia Procedure Notes (Signed)
Date/Time: 09/29/2018 9:33 AM Performed by: Doreen Salvage, CRNA Pre-anesthesia Checklist: Patient identified, Emergency Drugs available, Suction available and Patient being monitored Patient Re-evaluated:Patient Re-evaluated prior to induction Oxygen Delivery Method: Nasal cannula Induction Type: IV induction Dental Injury: Teeth and Oropharynx as per pre-operative assessment  Comments: Nasal cannula with etCO2 monitoring

## 2018-09-29 NOTE — Anesthesia Post-op Follow-up Note (Signed)
Anesthesia QCDR form completed.        

## 2018-09-29 NOTE — H&P (Signed)
Julia Antigua, MD [address] [place], [place], [place], [address] [address], [place], [place], [place], [address] Phone: [phone]  Fax: [phone]  Primary Care Physician:  Venita Lick, NP   Pre-Procedure History & Physical: HPI:  Julia Wood is a 58 y.o. female is here for a colonoscopy.   Past Medical History:  Diagnosis Date  . Diabetes mellitus without complication ([place])   . Hyperlipidemia   . Hypertension     Past Surgical History:  Procedure Laterality Date  . STOMACH SURGERY      Prior to Admission medications   Medication Sig Start Date End Date Taking? Authorizing Provider  chlorpheniramine-HYDROcodone (TUSSIONEX PENNKINETIC ER) 10-8 MG/5ML SUER Take 5 mLs by mouth every 12 (twelve) hours as needed for cough. 09/23/18  Yes Cannady, Jolene T, NP  hydrochlorothiazide (HYDRODIURIL) 25 MG tablet Take 1 tablet by mouth daily. 07/11/18 07/11/19 Yes [provider]  ibuprofen (ADVIL,MOTRIN) 600 MG tablet Take 1 tablet (600 mg total) by mouth every 6 (six) hours as needed. 09/10/18  Yes Laban Emperor, PA-C  ondansetron (ZOFRAN) 4 MG tablet Take 1 tablet (4 mg total) by mouth daily as needed for nausea or vomiting. 09/10/18 09/10/19 Yes Laban Emperor, PA-C  acetaminophen (TYLENOL) 500 MG tablet Take 1 tablet (500 mg total) by mouth every 6 (six) hours as needed. 09/10/18   Laban Emperor, PA-C  amLODipine (NORVASC) 5 MG tablet Take 1 tablet by mouth daily. 08/02/18 08/02/19  [provider]  aspirin EC 81 MG tablet Take 81 mg by mouth daily.    [provider]  benzonatate (TESSALON PERLES) 100 MG capsule Take 1 capsule (100 mg total) by mouth 3 (three) times daily as needed for cough. 09/10/18 09/10/19  Laban Emperor, PA-C  rosuvastatin (CRESTOR) 10 MG tablet Take 1 tablet (10 mg total) by mouth daily. 09/02/18   Marnee Guarneri T, NP    Allergies as of 09/23/2018  . (No Known Allergies)    Family History  Problem Relation Age  of Onset  . Hypertension Mother     Social History   Socioeconomic History  . Marital status: Single    Spouse name: Not on file  . Number of children: Not on file  . Years of education: Not on file  . Highest education level: Not on file  Occupational History  . Not on file  Social Needs  . Financial resource strain: Somewhat hard  . Food insecurity:    Worry: Sometimes true    Inability: Sometimes true  . Transportation needs:    Medical: No    Non-medical: No  Tobacco Use  . Smoking status: Never Smoker  . Smokeless tobacco: Never Used  Substance and Sexual Activity  . Alcohol use: Never    Frequency: Never  . Drug use: Never  . Sexual activity: Not Currently  Lifestyle  . Physical activity:    Days per week: 3 days    Minutes per session: 20 min  . Stress: Not at all  Relationships  . Social connections:    Talks on phone: More than three times a week    Gets together: More than three times a week    Attends religious service: 1 to 4 times per year    Active member of club or organization: No    Attends meetings of clubs or organizations: Never    Relationship status: Not on file  . Intimate partner violence:    Fear of current or ex partner:  No    Emotionally abused: No    Physically abused: No    Forced sexual activity: No  Other Topics Concern  . Not on file  Social History Narrative  . Not on file    Review of Systems: See HPI, otherwise negative ROS  Physical Exam: BP 140/87   Pulse 69   Temp (!) 96.9 F (36.1 C) (Tympanic)   Resp 18   Ht 5\' 3"  (1.6 m)   Wt 81.2 kg   LMP  (LMP Unknown)   SpO2 100%   BMI 31.71 kg/m  General:   Alert,  pleasant and cooperative in NAD Head:  Normocephalic and atraumatic. Neck:  Supple; no masses or thyromegaly. Lungs:  Clear throughout to auscultation, normal respiratory effort.    Heart:  +S1, +S2, Regular rate and rhythm, No edema. Abdomen:  Soft, nontender and nondistended. Normal bowel sounds,  without guarding, and without rebound.   Neurologic:  Alert and  oriented x4;  grossly normal neurologically.  Impression/Plan: Julia Wood is here for a colonoscopy to be performed for average risk screening.  Risks, benefits, limitations, and alternatives regarding  colonoscopy have been reviewed with the patient.  Questions have been answered.  All parties agreeable.   Virgel Manifold, MD  09/29/2018, 9:26 AM

## 2018-09-29 NOTE — Transfer of Care (Signed)
Immediate Anesthesia Transfer of Care Note  Patient: Julia Wood  Procedure(s) Performed: Procedure(s): COLONOSCOPY WITH PROPOFOL (N/A)  Patient Location: PACU and Endoscopy Unit  Anesthesia Type:General  Level of Consciousness: sedated  Airway & Oxygen Therapy: Patient Spontanous Breathing and Patient connected to nasal cannula oxygen  Post-op Assessment: Report given to RN and Post -op Vital signs reviewed and stable  Post vital signs: Reviewed and stable  Last Vitals:  Vitals:   09/29/18 0909 09/29/18 1016  BP: 140/87 110/78  Pulse: 69 72  Resp: 18 19  Temp: (!) 36.1 C 36.6 C  SpO2: 967% 59%    Complications: No apparent anesthesia complications

## 2018-09-30 ENCOUNTER — Encounter: Payer: Self-pay | Admitting: Gastroenterology

## 2018-09-30 ENCOUNTER — Telehealth: Payer: Self-pay | Admitting: Nurse Practitioner

## 2018-09-30 DIAGNOSIS — E119 Type 2 diabetes mellitus without complications: Secondary | ICD-10-CM

## 2018-09-30 LAB — SURGICAL PATHOLOGY

## 2018-09-30 MED ORDER — METFORMIN HCL 500 MG PO TABS: ORAL_TABLET | ORAL | 3 refills | Status: DC

## 2018-09-30 NOTE — Telephone Encounter (Signed)
Reviewed Julia Wood labs with her.  Discussed with her that her A1C was 7.2 and this is in diabetes range.  Educated her on A1C and levels.  Recommended starting Metformin and educated her on this medication + side effects.  Also recommended referral to diabetes education, to learn about appropriate diet to eat to help decrease A1C and improve diabetes.  She agrees with plan of care. Script for Metformin sent.  Will start out lower dose 500 MG once a day x one week and then increase to one tablet twice a day if tolerating once a day dosing.  She is to notify provider with any concerns.  Scheduled appointment to return in 3 months to recheck A1C.

## 2018-10-08 NOTE — Anesthesia Postprocedure Evaluation (Signed)
Anesthesia Post Note  Patient: Julia Wood  Procedure(s) Performed: COLONOSCOPY WITH PROPOFOL (N/A )  Patient location during evaluation: PACU Anesthesia Type: General Level of consciousness: awake and alert Pain management: pain level controlled Vital Signs Assessment: post-procedure vital signs reviewed and stable Respiratory status: spontaneous breathing, nonlabored ventilation, respiratory function stable and patient connected to nasal cannula oxygen Cardiovascular status: blood pressure returned to baseline and stable Postop Assessment: no apparent nausea or vomiting Anesthetic complications: no     Last Vitals:  Vitals:   09/29/18 0909 09/29/18 1016  BP: 140/87 110/78  Pulse: 69 72  Resp: 18 19  Temp: (!) 36.1 C 36.6 C  SpO2: 100% 96%    Last Pain:  Vitals:   09/30/18 0805  TempSrc:   PainSc: 0-No pain                 Molli Barrows

## 2018-11-14 ENCOUNTER — Other Ambulatory Visit: Payer: Self-pay | Admitting: Nurse Practitioner

## 2018-11-14 MED ORDER — AMLODIPINE BESYLATE 5 MG PO TABS: 5.0000 mg | ORAL_TABLET | Freq: Every day | ORAL | 11 refills | Status: DC

## 2018-11-14 NOTE — Telephone Encounter (Signed)
Requested medication (s) are due for refill today: Yes  Requested medication (s) are on the active medication list: Yes  Last refill:  1 month ago by historical provider  Future visit scheduled: Yes  Notes to clinic:  Unable to refill, last refill by another provider     Requested Prescriptions  Pending Prescriptions Disp Refills   amLODipine (NORVASC) 5 MG tablet 30 tablet 11    Sig: Take 1 tablet (5 mg total) by mouth daily.     Cardiovascular:  Calcium Channel Blockers Passed - 11/14/2018 12:32 PM      Passed - Last BP in normal range    BP Readings from Last 1 Encounters:  09/29/18 110/78         Passed - Valid encounter within last 6 months    Recent Outpatient Visits          1 month ago Annual physical exam   [place], Barbaraann Faster, NP   2 months ago Essential hypertension   [place], Barbaraann Faster, NP      Future Appointments            In 1 month Cannady, Barbaraann Faster, NP [facility], [place]   In 4 months [place], Barbaraann Faster, NP [facility]

## 2018-12-29 ENCOUNTER — Other Ambulatory Visit: Payer: Self-pay

## 2018-12-29 ENCOUNTER — Encounter: Payer: Self-pay | Admitting: Nurse Practitioner

## 2018-12-29 ENCOUNTER — Ambulatory Visit (INDEPENDENT_AMBULATORY_CARE_PROVIDER_SITE_OTHER): Payer: BLUE CROSS/BLUE SHIELD | Admitting: Nurse Practitioner

## 2018-12-29 VITALS — BP 134/83 | HR 71 | Temp 98.4°F | Ht 63.0 in | Wt 174.0 lb

## 2018-12-29 DIAGNOSIS — E1169 Type 2 diabetes mellitus with other specified complication: Secondary | ICD-10-CM

## 2018-12-29 DIAGNOSIS — E119 Type 2 diabetes mellitus without complications: Secondary | ICD-10-CM | POA: Diagnosis not present

## 2018-12-29 DIAGNOSIS — E785 Hyperlipidemia, unspecified: Secondary | ICD-10-CM

## 2018-12-29 DIAGNOSIS — I10 Essential (primary) hypertension: Secondary | ICD-10-CM | POA: Diagnosis not present

## 2018-12-29 NOTE — Assessment & Plan Note (Signed)
Chronic, ongoing.  Continue current medication regimen.  Labs ordered.

## 2018-12-29 NOTE — Patient Instructions (Signed)
Carbohydrate Counting for Diabetes Mellitus, Adult  Carbohydrate counting is a method of keeping track of how many carbohydrates you eat. Eating carbohydrates naturally increases the amount of sugar (glucose) in the blood. Counting how many carbohydrates you eat helps keep your blood glucose within normal limits, which helps you manage your diabetes (diabetes mellitus). It is important to know how many carbohydrates you can safely have in each meal. This is different for every person. A diet and nutrition specialist (registered dietitian) can help you make a meal plan and calculate how many carbohydrates you should have at each meal and snack. Carbohydrates are found in the following foods:  Grains, such as breads and cereals.  Dried beans and soy products.  Starchy vegetables, such as potatoes, peas, and corn.  Fruit and fruit juices.  Milk and yogurt.  Sweets and snack foods, such as cake, cookies, candy, chips, and soft drinks. How do I count carbohydrates? There are two ways to count carbohydrates in food. You can use either of the methods or a combination of both. Reading "Nutrition Facts" on packaged food The "Nutrition Facts" list is included on the labels of almost all packaged foods and beverages in the [place] It includes:  The serving size.  Information about nutrients in each serving, including the grams (g) of carbohydrate per serving. To use the "Nutrition Facts":  Decide how many servings you will have.  Multiply the number of servings by the number of carbohydrates per serving.  The resulting number is the total amount of carbohydrates that you will be having. Learning standard serving sizes of other foods When you eat carbohydrate foods that are not packaged or do not include "Nutrition Facts" on the label, you need to measure the servings in order to count the amount of carbohydrates:  Measure the foods that you will eat with a food scale or measuring cup, if needed.   Decide how many standard-size servings you will eat.  Multiply the number of servings by 15. Most carbohydrate-rich foods have about 15 g of carbohydrates per serving. ? For example, if you eat 8 oz (170 g) of strawberries, you will have eaten 2 servings and 30 g of carbohydrates (2 servings x 15 g = 30 g).  For foods that have more than one food mixed, such as soups and casseroles, you must count the carbohydrates in each food that is included. The following list contains standard serving sizes of common carbohydrate-rich foods. Each of these servings has about 15 g of carbohydrates:   hamburger bun or  English muffin.   oz (15 mL) syrup.   oz (14 g) jelly.  1 slice of bread.  1 six-inch tortilla.  3 oz (85 g) cooked rice or pasta.  4 oz (113 g) cooked dried beans.  4 oz (113 g) starchy vegetable, such as peas, corn, or potatoes.  4 oz (113 g) hot cereal.  4 oz (113 g) mashed potatoes or  of a large baked potato.  4 oz (113 g) canned or frozen fruit.  4 oz (120 mL) fruit juice.  4-6 crackers.  6 chicken nuggets.  6 oz (170 g) unsweetened dry cereal.  6 oz (170 g) plain fat-free yogurt or yogurt sweetened with artificial sweeteners.  8 oz (240 mL) milk.  8 oz (170 g) fresh fruit or one small piece of fruit.  24 oz (680 g) popped popcorn. Example of carbohydrate counting Sample meal  3 oz (85 g) chicken breast.  6 oz (170 g)   brown rice.  4 oz (113 g) corn.  8 oz (240 mL) milk.  8 oz (170 g) strawberries with sugar-free whipped topping. Carbohydrate calculation 1. Identify the foods that contain carbohydrates: ? Rice. ? Corn. ? Milk. ? Strawberries. 2. Calculate how many servings you have of each food: ? 2 servings rice. ? 1 serving corn. ? 1 serving milk. ? 1 serving strawberries. 3. Multiply each number of servings by 15 g: ? 2 servings rice x 15 g = 30 g. ? 1 serving corn x 15 g = 15 g. ? 1 serving milk x 15 g = 15 g. ? 1 serving  strawberries x 15 g = 15 g. 4. Add together all of the amounts to find the total grams of carbohydrates eaten: ? 30 g + 15 g + 15 g + 15 g = 75 g of carbohydrates total. Summary  Carbohydrate counting is a method of keeping track of how many carbohydrates you eat.  Eating carbohydrates naturally increases the amount of sugar (glucose) in the blood.  Counting how many carbohydrates you eat helps keep your blood glucose within normal limits, which helps you manage your diabetes.  A diet and nutrition specialist (registered dietitian) can help you make a meal plan and calculate how many carbohydrates you should have at each meal and snack. This information is not intended to replace advice given to you by your health care provider. Make sure you discuss any questions you have with your health care provider. Document Released: 07/20/2005 Document Revised: 01/27/2017 Document Reviewed: 01/01/2016 Elsevier Interactive Patient Education  2019 Elsevier Inc.  

## 2018-12-29 NOTE — Assessment & Plan Note (Addendum)
Chronic, ongoing.  BP at goal today.  Continue current medication regimen and recommend focus on DASH diet and regular exercise.  Labs ordered.

## 2018-12-29 NOTE — Assessment & Plan Note (Addendum)
New diagnosis, tolerating Metformin.  Continue medication.  Perform outpatient labs and check A1C.  Return in 3 months.

## 2018-12-29 NOTE — Progress Notes (Signed)
BP 134/83   Pulse 71   Temp 98.4 F (36.9 C) (Oral)   Ht 5\' 3"  (1.6 m)   Wt 174 lb (78.9 kg)   LMP  (LMP Unknown)   SpO2 98%   BMI 30.82 kg/m    Subjective:    Patient ID: Julia Wood, female    DOB: [birth year], 58 y.o.   MRN: [id number]  HPI: Julia Wood is a 58 y.o. female  Chief Complaint  Patient presents with  . Impaired fasting glucose    f/u  . Hypertension  . Hyperlipidemia    . This visit was completed via telephone due to the restrictions of the COVID-19 pandemic. All issues as above were discussed and addressed but no physical exam was performed. If it was felt that the patient should be evaluated in the office, they were directed there. The patient verbally consented to this visit. Patient was unable to complete an audio/visual visit due to Lack of equipment. Due to the catastrophic nature of the COVID-19 pandemic, this visit was done through audio contact only. . Location of the patient: home . Location of the provider: home . Those involved with this call:  . Provider: Marnee Guarneri, DNP . CMA: Lesle Chris, [place] . Front Desk/Registration: Jill Side  . Time spent on call: 15 minutes on the phone discussing health concerns. 5 minutes total spent in review of patient's record and preparation of their chart. I verified patient identity using two factors (patient name and date of birth). Patient consents verbally to being seen via telemedicine visit today.   DIABETES A1C 7.2% at recent visit, Metformin started.  She reports she has been taking it consistently and denies any ADR. Does not have glucometer and was unable to attend diabetes education due to her work schedule.  Will order glucometer next visit and discussed with her that she can bring it in, so provider or nurses can teach her how to use.  Hypoglycemic episodes:no Polydipsia/polyuria: no Visual disturbance: no Chest pain: no Paresthesias: no Glucose Monitoring: no  Accucheck frequency:  Not Checking  Fasting glucose:  Post prandial:  Evening:  Before meals: Taking Insulin?: no  Long acting insulin:  Short acting insulin: Blood Pressure Monitoring: daily Retinal Examination: Not up to Date Foot Exam: Not up to Date Diabetic Education: Not Completed Pneumovax: Not up to Date Influenza: Up to Date Aspirin: yes  HYPERTENSION / HYPERLIPIDEMIA Continues on Crestor and HCTZ + Norvasc. Satisfied with current treatment? yes Duration of hypertension: chronic BP monitoring frequency: daily BP range: 130/70-80 range BP medication side effects: no Duration of hyperlipidemia: chronic Cholesterol medication side effects: no Cholesterol supplements: none Medication compliance: good compliance Aspirin: yes Recent stressors: no Recurrent headaches: no Visual changes: no Palpitations: no Dyspnea: no Chest pain: no Lower extremity edema: no Dizzy/lightheaded: no   Relevant past medical, surgical, family and social history reviewed and updated as indicated. Interim medical history since our last visit reviewed. Allergies and medications reviewed and updated.  Review of Systems  Constitutional: Negative for activity change, appetite change, diaphoresis, fatigue and fever.  Respiratory: Negative for cough, chest tightness and shortness of breath.   Cardiovascular: Negative for chest pain, palpitations and leg swelling.  Gastrointestinal: Negative for abdominal distention, abdominal pain, constipation, diarrhea, nausea and vomiting.  Endocrine: Negative for cold intolerance, heat intolerance, polydipsia, polyphagia and polyuria.  Neurological: Negative for dizziness, syncope, weakness, light-headedness, numbness and headaches.  Psychiatric/Behavioral: Negative.     Per HPI unless specifically indicated above  Objective:    BP 134/83   Pulse 71   Temp 98.4 F (36.9 C) (Oral)   Ht 5\' 3"  (1.6 m)   Wt 174 lb (78.9 kg)   LMP  (LMP Unknown)   SpO2 98%   BMI 30.82  kg/m   Wt Readings from Last 3 Encounters:  12/29/18 174 lb (78.9 kg)  09/29/18 179 lb (81.2 kg)  09/23/18 178 lb 14.4 oz (81.1 kg)    Physical Exam   No physical exam due to telephone only visit.  Results for orders placed or performed during the hospital encounter of 09/29/18  Glucose, capillary  Result Value Ref Range   Glucose-Capillary 121 (H) 70 - 99 mg/dL  Surgical pathology  Result Value Ref Range   SURGICAL PATHOLOGY      Surgical Pathology CASE: [id number] PATIENT: Denyce Balster Surgical Pathology Report     SPECIMEN SUBMITTED: A. Colon polyp, ascending; cold snare  CLINICAL HISTORY: None provided  PRE-OPERATIVE DIAGNOSIS: Screening colonoscopy  POST-OPERATIVE DIAGNOSIS: Diverticulosis     DIAGNOSIS: A.  COLON POLYP, ASCENDING; COLD SNARE: - TUBULAR ADENOMA. - NEGATIVE FOR HIGH-GRADE DYSPLASIA AND MALIGNANCY.   GROSS DESCRIPTION: A. Labeled: Cold snare polyp ascending colon Received: Formalin Tissue fragment(s): Several Size: Aggregate, 0.4 x 0.4 x 0.1 cm Description: Tan soft tissue fragments admixed with possible food/fecal material Entirely submitted in A1.   Final Diagnosis performed by Quay Burow, MD.   Electronically signed 09/30/2018 9:55:37AM The electronic signature indicates that the named Attending Pathologist has evaluated the specimen  Technical component performed at [facility], [address], [place], [place] [address] Lab: [phone] Dir: Alyson Reedy, MD, MMM  Professional component performed at [facility], [facility], [place], [place], [place] [address] Lab: [phone] Dir: Dellia Nims. Reuel Derby, MD       Assessment & Plan:   Problem List Items Addressed This Visit      Cardiovascular and Mediastinum   Essential hypertension    Chronic, ongoing.  BP at goal today.  Continue current medication regimen and recommend focus on DASH diet and regular exercise.  Labs ordered.      Relevant  Orders   Comprehensive metabolic panel     Endocrine   Hyperlipidemia associated with type 2 diabetes mellitus (HCC)    Chronic, ongoing.  Continue current medication regimen.  Labs ordered.      Relevant Orders   Comprehensive metabolic panel   Lipid Panel w/o Chol/HDL Ratio   Type 2 diabetes mellitus without complication, without long-term current use of insulin (HCC) - Primary    New diagnosis, tolerating Metformin.  Continue medication.  Perform outpatient labs and check A1C.  Return in 3 months.      Relevant Orders   Bayer DCA Hb A1c Waived   Microalbumin, Urine Waived      I discussed the assessment and treatment plan with the patient. The patient was provided an opportunity to ask questions and all were answered. The patient agreed with the plan and demonstrated an understanding of the instructions.   The patient was advised to call back or seek an in-person evaluation if the symptoms worsen or if the condition fails to improve as anticipated.   I provided 15 minutes of time during this encounter.  Follow up plan: Return in about 3 months (around 03/31/2019) for T2DM, HTN/HLD.

## 2018-12-30 NOTE — Addendum Note (Signed)
Addended by: Marnee Guarneri T on: 12/30/2018 06:16 PM   Modules accepted: Level of Service

## 2019-01-06 ENCOUNTER — Other Ambulatory Visit: Payer: Self-pay | Admitting: Nurse Practitioner

## 2019-01-06 ENCOUNTER — Other Ambulatory Visit: Payer: Self-pay

## 2019-01-06 ENCOUNTER — Other Ambulatory Visit: Payer: BLUE CROSS/BLUE SHIELD

## 2019-01-06 ENCOUNTER — Telehealth: Payer: Self-pay | Admitting: Nurse Practitioner

## 2019-01-06 DIAGNOSIS — E785 Hyperlipidemia, unspecified: Secondary | ICD-10-CM

## 2019-01-06 DIAGNOSIS — I10 Essential (primary) hypertension: Secondary | ICD-10-CM

## 2019-01-06 DIAGNOSIS — E119 Type 2 diabetes mellitus without complications: Secondary | ICD-10-CM

## 2019-01-06 DIAGNOSIS — E1169 Type 2 diabetes mellitus with other specified complication: Secondary | ICD-10-CM

## 2019-01-06 LAB — MICROALBUMIN, URINE WAIVED
Creatinine, Urine Waived: 100 mg/dL (ref 10–300)
Microalb, Ur Waived: 30 mg/L — ABNORMAL HIGH (ref 0–19)

## 2019-01-06 LAB — BAYER DCA HB A1C WAIVED: HB A1C (BAYER DCA - WAIVED): 6.4 % (ref <7.0)

## 2019-01-06 MED ORDER — ROSUVASTATIN CALCIUM 10 MG PO TABS: 10.0000 mg | ORAL_TABLET | Freq: Every day | ORAL | 3 refills | Status: DC

## 2019-01-06 NOTE — Telephone Encounter (Signed)
Pt presented in office for lab work. She states that she needs a refill on her rosuvastatin 10MG  and still uses [facility] on [address]. Please advise.

## 2019-01-06 NOTE — Telephone Encounter (Signed)
Called pt to let her know medication has been sent to the pharmacy, no answer, no voicemail set up

## 2019-01-06 NOTE — Telephone Encounter (Signed)
Refill sent.

## 2019-01-07 LAB — COMPREHENSIVE METABOLIC PANEL
ALT: 13 IU/L (ref 0–32)
AST: 19 IU/L (ref 0–40)
Albumin/Globulin Ratio: 1.8 (ref 1.2–2.2)
Albumin: 4.2 g/dL (ref 3.8–4.9)
Alkaline Phosphatase: 69 IU/L (ref 39–117)
BUN/Creatinine Ratio: 19 (ref 9–23)
BUN: 14 mg/dL (ref 6–24)
Bilirubin Total: 0.2 mg/dL (ref 0.0–1.2)
CO2: 26 mmol/L (ref 20–29)
Calcium: 9.5 mg/dL (ref 8.7–10.2)
Chloride: 102 mmol/L (ref 96–106)
Creatinine, Ser: 0.74 mg/dL (ref 0.57–1.00)
GFR calc Af Amer: 103 mL/min/{1.73_m2} (ref >59)
GFR calc non Af Amer: 89 mL/min/{1.73_m2} (ref >59)
Globulin, Total: 2.4 g/dL (ref 1.5–4.5)
Glucose: 117 mg/dL — ABNORMAL HIGH (ref 65–99)
Potassium: 3.6 mmol/L (ref 3.5–5.2)
Sodium: 143 mmol/L (ref 134–144)
Total Protein: 6.6 g/dL (ref 6.0–8.5)

## 2019-01-07 LAB — LIPID PANEL W/O CHOL/HDL RATIO
Cholesterol, Total: 93 mg/dL — ABNORMAL LOW (ref 100–199)
HDL: 30 mg/dL — ABNORMAL LOW (ref >39)
LDL Calculated: 43 mg/dL (ref 0–99)
Triglycerides: 100 mg/dL (ref 0–149)
VLDL Cholesterol Cal: 20 mg/dL (ref 5–40)

## 2019-01-17 ENCOUNTER — Other Ambulatory Visit: Payer: Self-pay | Admitting: Nurse Practitioner

## 2019-03-24 ENCOUNTER — Other Ambulatory Visit: Payer: Self-pay

## 2019-03-24 ENCOUNTER — Ambulatory Visit (INDEPENDENT_AMBULATORY_CARE_PROVIDER_SITE_OTHER): Payer: BLUE CROSS/BLUE SHIELD | Admitting: Nurse Practitioner

## 2019-03-24 ENCOUNTER — Encounter: Payer: Self-pay | Admitting: Nurse Practitioner

## 2019-03-24 VITALS — BP 130/70 | HR 75 | Temp 99.2°F

## 2019-03-24 DIAGNOSIS — E1169 Type 2 diabetes mellitus with other specified complication: Secondary | ICD-10-CM

## 2019-03-24 DIAGNOSIS — E785 Hyperlipidemia, unspecified: Secondary | ICD-10-CM | POA: Diagnosis not present

## 2019-03-24 DIAGNOSIS — I10 Essential (primary) hypertension: Secondary | ICD-10-CM | POA: Diagnosis not present

## 2019-03-24 DIAGNOSIS — E119 Type 2 diabetes mellitus without complications: Secondary | ICD-10-CM | POA: Diagnosis not present

## 2019-03-24 LAB — BAYER DCA HB A1C WAIVED: HB A1C (BAYER DCA - WAIVED): 6.1 % (ref <7.0)

## 2019-03-24 MED ORDER — BLOOD GLUCOSE MONITOR KIT: PACK | 0 refills | Status: AC

## 2019-03-24 NOTE — Assessment & Plan Note (Signed)
Chronic, ongoing with initial BP elevated, but repeat at goal and home BP at goal.  Continue current regimen and adjust as needed.  Return in 3 months.

## 2019-03-24 NOTE — Progress Notes (Signed)
BP 130/70 (BP Location: Left Arm, Patient Position: Sitting)   Pulse 75   Temp 99.2 F (37.3 C) (Oral)   LMP  (LMP Unknown)   SpO2 97%    Subjective:    Patient ID: Julia Wood, female    DOB: 02/03/1961, 58 y.o.   MRN: [id number]  HPI: Julia Wood is a 58 y.o. female  Chief Complaint  Patient presents with  . Diabetes    no recent eye exam   . Hyperlipidemia  . Hypertension   DIABETES Last A1C 6.4%, initially placed on Metformin in February with A1C 7.2%.   Hypoglycemic episodes:no Polydipsia/polyuria: no Visual disturbance: no Chest pain: no Paresthesias: no Glucose Monitoring: yes  Accucheck frequency: Not Checking  Fasting glucose:  Post prandial:  Evening:  Before meals: Taking Insulin?: no  Long acting insulin:  Short acting insulin: Blood Pressure Monitoring: a few times a week Retinal Examination: Not up to Date Foot Exam: Up to Date Diabetic Education: Not Completed, did not attend Pneumovax: Not up to Date Influenza: Up to Date Aspirin: yes   HYPERTENSION / HYPERLIPIDEMIA Continues on HCTZ, Amlodipine, and Crestor. Satisfied with current treatment? yes Duration of hypertension: chronic BP monitoring frequency: a few times a week BP range: 128/70 this morning, been averaging 120/70 range at home BP medication side effects: no Duration of hyperlipidemia: chronic Cholesterol medication side effects: no Cholesterol supplements: none Medication compliance: good compliance Aspirin: yes Recent stressors: no Recurrent headaches: no Visual changes: no Palpitations: no Dyspnea: no Chest pain: no Lower extremity edema: no Dizzy/lightheaded: no   Relevant past medical, surgical, family and social history reviewed and updated as indicated. Interim medical history since our last visit reviewed. Allergies and medications reviewed and updated.  Review of Systems  Constitutional: Negative for activity change, appetite change, diaphoresis,  fatigue and fever.  Respiratory: Negative for cough, chest tightness and shortness of breath.   Cardiovascular: Negative for chest pain, palpitations and leg swelling.  Gastrointestinal: Negative for abdominal distention, abdominal pain, constipation, diarrhea, nausea and vomiting.  Endocrine: Negative for cold intolerance, heat intolerance, polydipsia, polyphagia and polyuria.  Neurological: Negative for dizziness, syncope, weakness, light-headedness, numbness and headaches.  Psychiatric/Behavioral: Negative.     Per HPI unless specifically indicated above     Objective:    BP 130/70 (BP Location: Left Arm, Patient Position: Sitting)   Pulse 75   Temp 99.2 F (37.3 C) (Oral)   LMP  (LMP Unknown)   SpO2 97%   Wt Readings from Last 3 Encounters:  12/29/18 174 lb (78.9 kg)  09/29/18 179 lb (81.2 kg)  09/23/18 178 lb 14.4 oz (81.1 kg)    Physical Exam Vitals signs and nursing note reviewed.  Constitutional:      General: She is awake. She is not in acute distress.    Appearance: She is well-developed and overweight. She is not ill-appearing.  HENT:     Head: Normocephalic.     Right Ear: Hearing normal.     Left Ear: Hearing normal.  Eyes:     General: Lids are normal.        Right eye: No discharge.        Left eye: No discharge.     Conjunctiva/sclera: Conjunctivae normal.     Pupils: Pupils are equal, round, and reactive to light.  Neck:     Musculoskeletal: Normal range of motion and neck supple.     Thyroid: No thyromegaly.     Vascular: No carotid bruit.  Cardiovascular:     Rate and Rhythm: Normal rate and regular rhythm.     Heart sounds: Normal heart sounds. No murmur. No gallop.   Pulmonary:     Effort: Pulmonary effort is normal. No accessory muscle usage or respiratory distress.     Breath sounds: Normal breath sounds.  Abdominal:     General: Bowel sounds are normal.     Palpations: Abdomen is soft.  Musculoskeletal:     Right lower leg: No edema.      Left lower leg: No edema.  Lymphadenopathy:     Cervical: No cervical adenopathy.  Skin:    General: Skin is warm and dry.  Neurological:     Mental Status: She is alert and oriented to person, place, and time.  Psychiatric:        Attention and Perception: Attention normal.        Mood and Affect: Mood normal.        Speech: Speech normal.        Behavior: Behavior normal. Behavior is cooperative.        Thought Content: Thought content normal.        Judgment: Judgment normal.    Diabetic Foot Exam - Simple   Simple Foot Form Visual Inspection No deformities, no ulcerations, no other skin breakdown bilaterally: Yes Sensation Testing Intact to touch and monofilament testing bilaterally: Yes Pulse Check Posterior Tibialis and Dorsalis pulse intact bilaterally: Yes Comments    Results for orders placed or performed in visit on 01/06/19  Lipid Panel w/o Chol/HDL Ratio  Result Value Ref Range   Cholesterol, Total 93 (L) 100 - 199 mg/dL   Triglycerides 100 0 - 149 mg/dL   HDL 30 (L) >39 mg/dL   VLDL Cholesterol Cal 20 5 - 40 mg/dL   LDL Calculated 43 0 - 99 mg/dL  Comprehensive metabolic panel  Result Value Ref Range   Glucose 117 (H) 65 - 99 mg/dL   BUN 14 6 - 24 mg/dL   Creatinine, Ser 0.74 0.57 - 1.00 mg/dL   GFR calc non Af Amer 89 >59 mL/min/1.73   GFR calc Af Amer 103 >59 mL/min/1.73   BUN/Creatinine Ratio 19 9 - 23   Sodium 143 134 - 144 mmol/L   Potassium 3.6 3.5 - 5.2 mmol/L   Chloride 102 96 - 106 mmol/L   CO2 26 20 - 29 mmol/L   Calcium 9.5 8.7 - 10.2 mg/dL   Total Protein 6.6 6.0 - 8.5 g/dL   Albumin 4.2 3.8 - 4.9 g/dL   Globulin, Total 2.4 1.5 - 4.5 g/dL   Albumin/Globulin Ratio 1.8 1.2 - 2.2   Bilirubin Total 0.2 0.0 - 1.2 mg/dL   Alkaline Phosphatase 69 39 - 117 IU/L   AST 19 0 - 40 IU/L   ALT 13 0 - 32 IU/L  Microalbumin, Urine Waived  Result Value Ref Range   Microalb, Ur Waived 30 (H) 0 - 19 mg/L   Creatinine, Urine Waived 100 10 - 300 mg/dL    Microalb/Creat Ratio 30-300 (H) <30 mg/g  Bayer DCA Hb A1c Waived  Result Value Ref Range   HB A1C (BAYER DCA - WAIVED) 6.4 <7.0 %      Assessment & Plan:   Problem List Items Addressed This Visit      Cardiovascular and Mediastinum   Essential hypertension    Chronic, ongoing with initial BP elevated, but repeat at goal and home BP at goal.  Continue current regimen and  adjust as needed.  Return in 3 months.        Endocrine   Hyperlipidemia associated with type 2 diabetes mellitus (HCC)    Chronic, ongoing.  Continue current medication regimen and adjust as needed.  Lipid panel next visit.      Type 2 diabetes mellitus without complication, without long-term current use of insulin (HCC) - Primary    Chronic, ongoing.  A1C today 6.1%.  Remains well-controlled with Metformin 500 MG BID, continue this at this time.  Recommend checking BS at home daily.  Return in 3 months.      Relevant Orders   Bayer DCA Hb A1c Waived       Follow up plan: Return in about 3 months (around 06/24/2019) for T2DM, HTN/HLD.

## 2019-03-24 NOTE — Patient Instructions (Signed)
Carbohydrate Counting for Diabetes Mellitus, Adult  Carbohydrate counting is a method of keeping track of how many carbohydrates you eat. Eating carbohydrates naturally increases the amount of sugar (glucose) in the blood. Counting how many carbohydrates you eat helps keep your blood glucose within normal limits, which helps you manage your diabetes (diabetes mellitus). It is important to know how many carbohydrates you can safely have in each meal. This is different for every person. A diet and nutrition specialist (registered dietitian) can help you make a meal plan and calculate how many carbohydrates you should have at each meal and snack. Carbohydrates are found in the following foods:  Grains, such as breads and cereals.  Dried beans and soy products.  Starchy vegetables, such as potatoes, peas, and corn.  Fruit and fruit juices.  Milk and yogurt.  Sweets and snack foods, such as cake, cookies, candy, chips, and soft drinks. How do I count carbohydrates? There are two ways to count carbohydrates in food. You can use either of the methods or a combination of both. Reading "Nutrition Facts" on packaged food The "Nutrition Facts" list is included on the labels of almost all packaged foods and beverages in the [place] It includes:  The serving size.  Information about nutrients in each serving, including the grams (g) of carbohydrate per serving. To use the "Nutrition Facts":  Decide how many servings you will have.  Multiply the number of servings by the number of carbohydrates per serving.  The resulting number is the total amount of carbohydrates that you will be having. Learning standard serving sizes of other foods When you eat carbohydrate foods that are not packaged or do not include "Nutrition Facts" on the label, you need to measure the servings in order to count the amount of carbohydrates:  Measure the foods that you will eat with a food scale or measuring cup, if needed.   Decide how many standard-size servings you will eat.  Multiply the number of servings by 15. Most carbohydrate-rich foods have about 15 g of carbohydrates per serving. ? For example, if you eat 8 oz (170 g) of strawberries, you will have eaten 2 servings and 30 g of carbohydrates (2 servings x 15 g = 30 g).  For foods that have more than one food mixed, such as soups and casseroles, you must count the carbohydrates in each food that is included. The following list contains standard serving sizes of common carbohydrate-rich foods. Each of these servings has about 15 g of carbohydrates:   hamburger bun or  English muffin.   oz (15 mL) syrup.   oz (14 g) jelly.  1 slice of bread.  1 six-inch tortilla.  3 oz (85 g) cooked rice or pasta.  4 oz (113 g) cooked dried beans.  4 oz (113 g) starchy vegetable, such as peas, corn, or potatoes.  4 oz (113 g) hot cereal.  4 oz (113 g) mashed potatoes or  of a large baked potato.  4 oz (113 g) canned or frozen fruit.  4 oz (120 mL) fruit juice.  4-6 crackers.  6 chicken nuggets.  6 oz (170 g) unsweetened dry cereal.  6 oz (170 g) plain fat-free yogurt or yogurt sweetened with artificial sweeteners.  8 oz (240 mL) milk.  8 oz (170 g) fresh fruit or one small piece of fruit.  24 oz (680 g) popped popcorn. Example of carbohydrate counting Sample meal  3 oz (85 g) chicken breast.  6 oz (170 g)   brown rice.  4 oz (113 g) corn.  8 oz (240 mL) milk.  8 oz (170 g) strawberries with sugar-free whipped topping. Carbohydrate calculation 1. Identify the foods that contain carbohydrates: ? Rice. ? Corn. ? Milk. ? Strawberries. 2. Calculate how many servings you have of each food: ? 2 servings rice. ? 1 serving corn. ? 1 serving milk. ? 1 serving strawberries. 3. Multiply each number of servings by 15 g: ? 2 servings rice x 15 g = 30 g. ? 1 serving corn x 15 g = 15 g. ? 1 serving milk x 15 g = 15 g. ? 1 serving  strawberries x 15 g = 15 g. 4. Add together all of the amounts to find the total grams of carbohydrates eaten: ? 30 g + 15 g + 15 g + 15 g = 75 g of carbohydrates total. Summary  Carbohydrate counting is a method of keeping track of how many carbohydrates you eat.  Eating carbohydrates naturally increases the amount of sugar (glucose) in the blood.  Counting how many carbohydrates you eat helps keep your blood glucose within normal limits, which helps you manage your diabetes.  A diet and nutrition specialist (registered dietitian) can help you make a meal plan and calculate how many carbohydrates you should have at each meal and snack. This information is not intended to replace advice given to you by your health care provider. Make sure you discuss any questions you have with your health care provider. Document Released: 07/20/2005 Document Revised: 02/11/2017 Document Reviewed: 01/01/2016 Elsevier Patient Education  2020 Elsevier Inc.  

## 2019-03-24 NOTE — Assessment & Plan Note (Signed)
Chronic, ongoing.  A1C today 6.1%.  Remains well-controlled with Metformin 500 MG BID, continue this at this time.  Recommend checking BS at home daily.  Return in 3 months.

## 2019-03-24 NOTE — Assessment & Plan Note (Signed)
Chronic, ongoing.  Continue current medication regimen and adjust as needed.  Lipid panel next visit.    

## 2019-04-12 ENCOUNTER — Ambulatory Visit: Payer: Self-pay | Admitting: *Deleted

## 2019-04-12 DIAGNOSIS — E119 Type 2 diabetes mellitus without complications: Secondary | ICD-10-CM

## 2019-04-14 NOTE — Chronic Care Management (AMB) (Signed)
  Care Management   Initial Visit Note  04/14/2019 Name: Julia Wood MRN: [id number] DOB: 10/03/1960    Assessment: Julia Wood is a 58 y.o. year old female who sees [place], Julia Screws T, NP for primary care. The care management team was consulted for assistance with care management and care coordination needs related to Disease Management Educational Needs.   Review of patient status, including review of consultants reports, relevant laboratory and other test results, and collaboration with appropriate care team members and the patient's provider was performed as part of comprehensive patient evaluation and provision of care management services.    SDOH (Social Determinants of Health) screening performed today: None. See Care Plan for related entries.   Outpatient Encounter Medications as of 04/12/2019  Medication Sig  . amLODipine (NORVASC) 5 MG tablet Take 1 tablet (5 mg total) by mouth daily.  Marland Kitchen aspirin EC 81 MG tablet Take 81 mg by mouth daily.  . blood glucose meter kit and supplies KIT Dispense based on patient and insurance preference. Use up to four times daily as directed. (FOR ICD-9 250.00, 250.01).  . hydrochlorothiazide (HYDRODIURIL) 25 MG tablet Take 1 tablet by mouth daily.  . metFORMIN (GLUCOPHAGE) 500 MG tablet Take 1 tablet once a day x one week and then second week start taking 1 tablet twice a day.  . rosuvastatin (CRESTOR) 10 MG tablet Take 1 tablet (10 mg total) by mouth daily.   No facility-administered encounter medications on file as of 04/12/2019.     Goals Addressed            This Visit's Progress   . RN- I don'Wood know how to use this glucometer (pt-stated)       Current Barriers:  Marland Kitchen Knowledge Deficits related to basic Diabetes pathophysiology and self care/management  Case Manager Clinical Goal(s):  Over the next 90 days, patient will demonstrate improved adherence to prescribed treatment plan for diabetes self care/management as evidenced by:  .  daily monitoring and recording of CBG   Interventions:  . Reviewed medications with patient and discussed importance of medication adherence . Advised patient, providing education and rationale, to check cbg QD and record, calling pcp for findings outside established parameters.   . Assisted patient in set up and instruction on newly obtained glucometer, patient verbalized understanding  Patient Self Care Activities:  . Attends all scheduled provider appointments  Initial goal documentation         Follow up plan:  The care management team will reach out to the patient again over the next 30 days.  The patient has been provided with contact information for the care management team and has been advised to call with any health related questions or concerns.   Julia Wood was given information about Care Management services today including:  1. Care Management services include personalized support from designated clinical staff supervised by a physician, including individualized plan of care and coordination with other care providers 2. 24/7 contact phone numbers for assistance for urgent and routine care needs. 3. The patient may stop CCM services at any time (effective at the end of the month) by phone call to the office staff.  Patient agreed to services and verbal consent obtained. Julia Morse Jarrette Dehner RN, BSN Nurse Case Editor, commissioning [facility] Care [facility]  [phone]) Business Mobile

## 2019-05-05 ENCOUNTER — Other Ambulatory Visit: Payer: Self-pay

## 2019-05-05 DIAGNOSIS — Z20822 Contact with and (suspected) exposure to covid-19: Secondary | ICD-10-CM

## 2019-05-06 LAB — NOVEL CORONAVIRUS, NAA: SARS-CoV-2, NAA: NOT DETECTED

## 2019-05-08 ENCOUNTER — Other Ambulatory Visit: Payer: Self-pay | Admitting: Nurse Practitioner

## 2019-05-08 MED ORDER — ROSUVASTATIN CALCIUM 10 MG PO TABS: 10.0000 mg | ORAL_TABLET | Freq: Every day | ORAL | 3 refills | Status: DC

## 2019-05-08 NOTE — Telephone Encounter (Signed)
rosuvastatin (CRESTOR) 10 MG tablet  Send to [facility]

## 2019-05-17 ENCOUNTER — Ambulatory Visit: Payer: Self-pay | Admitting: *Deleted

## 2019-05-17 ENCOUNTER — Ambulatory Visit: Payer: Self-pay | Admitting: Pharmacist

## 2019-05-17 ENCOUNTER — Other Ambulatory Visit: Payer: Self-pay | Admitting: Nurse Practitioner

## 2019-05-17 ENCOUNTER — Telehealth: Payer: Self-pay | Admitting: Nurse Practitioner

## 2019-05-17 DIAGNOSIS — E119 Type 2 diabetes mellitus without complications: Secondary | ICD-10-CM

## 2019-05-17 DIAGNOSIS — E1169 Type 2 diabetes mellitus with other specified complication: Secondary | ICD-10-CM

## 2019-05-17 DIAGNOSIS — I10 Essential (primary) hypertension: Secondary | ICD-10-CM

## 2019-05-17 MED ORDER — AMLODIPINE BESYLATE 5 MG PO TABS: 5.0000 mg | ORAL_TABLET | Freq: Every day | ORAL | 11 refills | Status: DC

## 2019-05-17 NOTE — Chronic Care Management (AMB) (Signed)
Chronic Care Management   Follow Up Note   05/17/2019 Name: Julia Wood MRN: [id number] DOB: 06-Jan-1961  Referred by: Venita Lick, NP Reason for referral : Chronic Care Management (Medication Management)   Julia Wood is a 58 y.o. year old female who is a primary care patient of Cannady, Barbaraann Faster, NP. The CCM team was consulted for assistance with chronic disease management and care coordination needs.    Met with patient face to face today when she presented to clinic with confusion regarding her medications.   Review of patient status, including review of consultants reports, relevant laboratory and other test results, and collaboration with appropriate care team members and the patient's provider was performed as part of comprehensive patient evaluation and provision of chronic care management services.    SDOH (Social Determinants of Health) screening performed today: Stress. See Care Plan for related entries.   Advanced Directives Status: N See Care Plan and Vynca application for related entries.  Outpatient Encounter Medications as of 05/17/2019  Medication Sig  . amLODipine (NORVASC) 5 MG tablet Take 1 tablet (5 mg total) by mouth daily.  Marland Kitchen aspirin EC 81 MG tablet Take 81 mg by mouth daily.  . blood glucose meter kit and supplies KIT Dispense based on patient and insurance preference. Use up to four times daily as directed. (FOR ICD-9 250.00, 250.01).  . hydrochlorothiazide (HYDRODIURIL) 25 MG tablet Take 1 tablet by mouth daily.  . metFORMIN (GLUCOPHAGE) 500 MG tablet Take 1 tablet once a day x one week and then second week start taking 1 tablet twice a day.  . rosuvastatin (CRESTOR) 10 MG tablet Take 1 tablet by mouth once daily   No facility-administered encounter medications on file as of 05/17/2019.      Goals Addressed            This Visit's Progress     Patient Stated   . PharmD "I'm confused by my medications" (pt-stated)       Current  Barriers:  . Polypharmacy;  patient with multiple comorbidities including T2DM, HTN, HLD . Self-manages medications by using a once daily pill box . Comes to clinic today confused about her medications. She notes that she thinks she threw away the refill of rosuvastatin that was sent on 05/08/2019 o T2DM: metformin 500 mg BID o HTN: amlodipine 10 mg daily (patient does not have this bottle with her; per outside refill, last filled 02/17/2019 for a 90 day supply, so she should be about due for a refill); HCTZ 25 mg daily o HLD: rosuvastatin 10 mg daily o Health maintenance: aspirin 81 mg daily   Pharmacist Clinical Goal(s):  Marland Kitchen Over the next 90 days, patient will work with PharmD and provider towards optimized medication management  Interventions: . Comprehensive medication review performed; medication list updated in electronic medical record . Reviewed medications with patient. Encouraged her to speak with the pharmacy about possibly having lost her rosuvastatin and see if they can issue a refill.  . Encouraged patient to request refill on amlodipine as well . Provided with a list of how to fill a pill box, and provided a BID pill box:  o AM: metformin, amlodipine, HCTZ, ASA o PM: metformin, rosuvastatin  Patient Self Care Activities:  . Patient will take medications as prescribed  Initial goal documentation         Plan:  - Will meet with patient face to face with next primary care provider appointment  Catie Darnelle Maffucci, PharmD  [place] [phone]

## 2019-05-17 NOTE — Chronic Care Management (AMB) (Signed)
  Chronic Care Management   Follow Up Note   05/17/2019 Name: Julia Wood MRN: [id number] DOB: [birth year]  Referred by: Venita Lick, NP Reason for referral : No chief complaint on file.   Julia Wood is a 58 y.o. year old female who is a primary care patient of Cannady, Barbaraann Faster, NP. The CCM team was consulted for assistance with chronic disease management and care coordination needs.    Review of patient status, including review of consultants reports, relevant laboratory and other test results, and collaboration with appropriate care team members and the patient's provider was performed as part of comprehensive patient evaluation and provision of chronic care management services.    SDOH (Social Determinants of Health) screening performed today: Physical Activity. See Care Plan for related entries.   Advanced Directives Status: N See Care Plan and Vynca application for related entries.  Outpatient Encounter Medications as of 05/17/2019  Medication Sig  . amLODipine (NORVASC) 5 MG tablet Take 1 tablet (5 mg total) by mouth daily.  Marland Kitchen aspirin EC 81 MG tablet Take 81 mg by mouth daily.  . blood glucose meter kit and supplies KIT Dispense based on patient and insurance preference. Use up to four times daily as directed. (FOR ICD-9 250.00, 250.01).  . hydrochlorothiazide (HYDRODIURIL) 25 MG tablet Take 1 tablet by mouth daily.  . metFORMIN (GLUCOPHAGE) 500 MG tablet Take 1 tablet once a day x one week and then second week start taking 1 tablet twice a day.  . rosuvastatin (CRESTOR) 10 MG tablet Take 1 tablet by mouth once daily   No facility-administered encounter medications on file as of 05/17/2019.      Goals Addressed            This Visit's Progress   . RN- I don't know how to use this glucometer (pt-stated)       Current Barriers:  Marland Kitchen Knowledge Deficits related to basic Diabetes pathophysiology and self care/management  Case Manager Clinical Goal(s):  Over  the next 90 days, patient will demonstrate improved adherence to prescribed treatment plan for diabetes self care/management as evidenced by:  . daily monitoring and recording of CBG   Interventions:  . Provided education to patient about basic DM disease process . Reviewed medications with patient and discussed importance of medication adherence . Discussed plans with patient for ongoing care management follow up and provided patient with direct contact information for care management team . Advised patient, providing education and rationale, to check cbg QD and record, calling pcp for findings outside established parameters.   . Discussed rule of 15 and s/s of hypoglycemia  . Patient confirmed medications were ready to be picked up at [facility]. . Will continue to assist with diabetes management   Patient Self Care Activities:  . Attends all scheduled provider appointments  Please see past updates related to this goal by clicking on the "Past Updates" button in the selected goal          The care management team will reach out to the patient again over the next 60 days.  The patient has been provided with contact information for the care management team and has been advised to call with any health related questions or concerns.   Merlene Morse Dhruvi Crenshaw RN, BSN Nurse Case Editor, commissioning [facility] Practice[facility]  [phone]) Business Mobile

## 2019-05-17 NOTE — Telephone Encounter (Signed)
Pt presented in office stating that she think that she threw her medication (crestor) away. Per Catie pt is to check with the pharmacy to see if they can refill it. If not she will contact us back.

## 2019-05-17 NOTE — Telephone Encounter (Signed)
Medication Refill - Medication: amLODipine (NORVASC) 5 MG tablet and rosuvastatin (CRESTOR) 10 MG tablet    Preferred Pharmacy (with phone number or street name):  [facility] (N),  - [place] [phone] (Phone) [phone] (Fax)

## 2019-05-17 NOTE — Telephone Encounter (Signed)
Noted, thank you

## 2019-05-17 NOTE — Patient Instructions (Signed)
Visit Information  Goals Addressed            This Visit's Progress     Patient Stated   . PharmD "I'm confused by my medications" (pt-stated)       Current Barriers:  . Polypharmacy;  patient with multiple comorbidities including T2DM, HTN, HLD . Self-manages medications by using a once daily pill box . Comes to clinic today confused about her medications. She notes that she thinks she threw away the refill of rosuvastatin that was sent on 05/08/2019 o T2DM: metformin 500 mg BID o HTN: amlodipine 10 mg daily (patient does not have this bottle with her; per outside refill, last filled 02/17/2019 for a 90 day supply, so she should be about due for a refill); HCTZ 25 mg daily o HLD: rosuvastatin 10 mg daily o Health maintenance: aspirin 81 mg daily   Pharmacist Clinical Goal(s):  Marland Kitchen Over the next 90 days, patient will work with PharmD and provider towards optimized medication management  Interventions: . Comprehensive medication review performed; medication list updated in electronic medical record . Reviewed medications with patient. Encouraged her to speak with the pharmacy about possibly having lost her rosuvastatin and see if they can issue a refill.  . Encouraged patient to request refill on amlodipine as well . Provided with a list of how to fill a pill box, and provided a BID pill box:  o AM: metformin, amlodipine, HCTZ, ASA o PM: metformin, rosuvastatin  Patient Self Care Activities:  . Patient will take medications as prescribed  Initial goal documentation        The patient verbalized understanding of instructions provided today and declined a print copy of patient instruction materials.   Plan:  - Will meet with patient face to face with next primary care provider appointment  Catie Darnelle Maffucci, PharmD Clinical Pharmacist [place] [phone]

## 2019-05-21 NOTE — Patient Instructions (Signed)
Thank you allowing the Chronic Care Management Team to be a part of your care! It was a pleasure speaking with you today!  CCM (Chronic Care Management) Team   Logyn Dedominicis RN, BSN Nurse Care Coordinator  [phone]  Catie [facility] PharmD  Clinical Pharmacist  [phone]  Eula Fried LCSW Clinical Social Worker [phone]  Goals Addressed            This Visit's Progress   . RN- I don't know how to use this glucometer (pt-stated)       Current Barriers:  Marland Kitchen Knowledge Deficits related to basic Diabetes pathophysiology and self care/management  Case Manager Clinical Goal(s):  Over the next 90 days, patient will demonstrate improved adherence to prescribed treatment plan for diabetes self care/management as evidenced by:  . daily monitoring and recording of CBG   Interventions:  . Provided education to patient about basic DM disease process . Reviewed medications with patient and discussed importance of medication adherence . Discussed plans with patient for ongoing care management follow up and provided patient with direct contact information for care management team . Advised patient, providing education and rationale, to check cbg QD and record, calling pcp for findings outside established parameters.   . Discussed rule of 15 and s/s of hypoglycemia  . Patient confirmed medications were ready to be picked up at [facility]. . Will continue to assist with diabetes management   Patient Self Care Activities:  . Attends all scheduled provider appointments  Please see past updates related to this goal by clicking on the "Past Updates" button in the selected goal         The patient verbalized understanding of instructions provided today and declined a print copy of patient instruction materials.   The patient has been provided with contact information for the care management team and has been advised to call with any health related questions or concerns.

## 2019-05-22 ENCOUNTER — Other Ambulatory Visit: Payer: Self-pay | Admitting: Nurse Practitioner

## 2019-05-22 MED ORDER — HYDROCHLOROTHIAZIDE 25 MG PO TABS: 25.0000 mg | ORAL_TABLET | Freq: Every day | ORAL | 3 refills | Status: DC

## 2019-05-22 NOTE — Telephone Encounter (Signed)
Requested medication (s) are due for refill today: yes  Requested medication (s) are on the active medication list: yes  Last refill:  08/2018  Future visit scheduled: yes  Notes to clinic:  Last filled by historical provider    Requested Prescriptions  Pending Prescriptions Disp Refills   hydrochlorothiazide (HYDRODIURIL) 25 MG tablet 30 tablet 11    Sig: Take 1 tablet (25 mg total) by mouth daily.     Cardiovascular: Diuretics - Thiazide Passed - 05/22/2019 11:40 AM      Passed - Ca in normal range and within 360 days    Calcium  Date Value Ref Range Status  01/06/2019 9.5 8.7 - 10.2 mg/dL Final         Passed - Cr in normal range and within 360 days    Creatinine, Ser  Date Value Ref Range Status  01/06/2019 0.74 0.57 - 1.00 mg/dL Final         Passed - K in normal range and within 360 days    Potassium  Date Value Ref Range Status  01/06/2019 3.6 3.5 - 5.2 mmol/L Final         Passed - Na in normal range and within 360 days    Sodium  Date Value Ref Range Status  01/06/2019 143 134 - 144 mmol/L Final         Passed - Last BP in normal range    BP Readings from Last 1 Encounters:  03/24/19 130/70         Passed - Valid encounter within last 6 months    Recent Outpatient Visits          1 month ago Type 2 diabetes mellitus without complication, without long-term current use of insulin ([place])   [place], Jolene T, NP   4 months ago Type 2 diabetes mellitus without complication, without long-term current use of insulin ([place])   [place], [place] T, NP   8 months ago Annual physical exam   [place] Cannady, Barbaraann Faster, NP   9 months ago Essential hypertension   [place], Barbaraann Faster, NP      Future Appointments            In 1 month Cannady, Barbaraann Faster, NP [employer]

## 2019-05-22 NOTE — Telephone Encounter (Signed)
Medication Refill - Medication: hydrochlorothiazide (HYDRODIURIL) 25 MG tablet    Has the patient contacted their pharmacy? Yes.   (Agent: If no, request that the patient contact the pharmacy for the refill.) (Agent: If yes, when and what did the pharmacy advise?)  Preferred Pharmacy (with phone number or street name):  [facility] [place] - [place] [address]  Phone: [phone] Fax: [phone]  Not a 24 hour pharmacy; exact hours not known.     Agent: Please be advised that RX refills may take up to 3 business days. We ask that you follow-up with your pharmacy.

## 2019-06-27 ENCOUNTER — Other Ambulatory Visit: Payer: Self-pay

## 2019-06-27 ENCOUNTER — Encounter: Payer: Self-pay | Admitting: Nurse Practitioner

## 2019-06-27 ENCOUNTER — Ambulatory Visit (INDEPENDENT_AMBULATORY_CARE_PROVIDER_SITE_OTHER): Payer: BLUE CROSS/BLUE SHIELD | Admitting: Nurse Practitioner

## 2019-06-27 ENCOUNTER — Telehealth: Payer: Self-pay

## 2019-06-27 DIAGNOSIS — E785 Hyperlipidemia, unspecified: Secondary | ICD-10-CM

## 2019-06-27 DIAGNOSIS — I10 Essential (primary) hypertension: Secondary | ICD-10-CM | POA: Diagnosis not present

## 2019-06-27 DIAGNOSIS — E119 Type 2 diabetes mellitus without complications: Secondary | ICD-10-CM

## 2019-06-27 DIAGNOSIS — E1169 Type 2 diabetes mellitus with other specified complication: Secondary | ICD-10-CM | POA: Diagnosis not present

## 2019-06-27 MED ORDER — ROSUVASTATIN CALCIUM 10 MG PO TABS: 10.0000 mg | ORAL_TABLET | Freq: Every day | ORAL | 3 refills | Status: DC

## 2019-06-27 NOTE — Assessment & Plan Note (Addendum)
Chronic, ongoing.  Continue current medication regimen and adjust as needed.  Lipid panel outpatient.  Refills sent.

## 2019-06-27 NOTE — Assessment & Plan Note (Signed)
Chronic, ongoing with home BP at goal.  Continue current regimen and adjust as needed.  Return in 3 months.  Obtain outpatient labs.

## 2019-06-27 NOTE — Patient Instructions (Signed)
Carbohydrate Counting for Diabetes Mellitus, Adult  Carbohydrate counting is a method of keeping track of how many carbohydrates you eat. Eating carbohydrates naturally increases the amount of sugar (glucose) in the blood. Counting how many carbohydrates you eat helps keep your blood glucose within normal limits, which helps you manage your diabetes (diabetes mellitus). It is important to know how many carbohydrates you can safely have in each meal. This is different for every person. A diet and nutrition specialist (registered dietitian) can help you make a meal plan and calculate how many carbohydrates you should have at each meal and snack. Carbohydrates are found in the following foods:  Grains, such as breads and cereals.  Dried beans and soy products.  Starchy vegetables, such as potatoes, peas, and corn.  Fruit and fruit juices.  Milk and yogurt.  Sweets and snack foods, such as cake, cookies, candy, chips, and soft drinks. How do I count carbohydrates? There are two ways to count carbohydrates in food. You can use either of the methods or a combination of both. Reading "Nutrition Facts" on packaged food The "Nutrition Facts" list is included on the labels of almost all packaged foods and beverages in the [place] It includes:  The serving size.  Information about nutrients in each serving, including the grams (g) of carbohydrate per serving. To use the "Nutrition Facts":  Decide how many servings you will have.  Multiply the number of servings by the number of carbohydrates per serving.  The resulting number is the total amount of carbohydrates that you will be having. Learning standard serving sizes of other foods When you eat carbohydrate foods that are not packaged or do not include "Nutrition Facts" on the label, you need to measure the servings in order to count the amount of carbohydrates:  Measure the foods that you will eat with a food scale or measuring cup, if needed.   Decide how many standard-size servings you will eat.  Multiply the number of servings by 15. Most carbohydrate-rich foods have about 15 g of carbohydrates per serving. ? For example, if you eat 8 oz (170 g) of strawberries, you will have eaten 2 servings and 30 g of carbohydrates (2 servings x 15 g = 30 g).  For foods that have more than one food mixed, such as soups and casseroles, you must count the carbohydrates in each food that is included. The following list contains standard serving sizes of common carbohydrate-rich foods. Each of these servings has about 15 g of carbohydrates:   hamburger bun or  English muffin.   oz (15 mL) syrup.   oz (14 g) jelly.  1 slice of bread.  1 six-inch tortilla.  3 oz (85 g) cooked rice or pasta.  4 oz (113 g) cooked dried beans.  4 oz (113 g) starchy vegetable, such as peas, corn, or potatoes.  4 oz (113 g) hot cereal.  4 oz (113 g) mashed potatoes or  of a large baked potato.  4 oz (113 g) canned or frozen fruit.  4 oz (120 mL) fruit juice.  4-6 crackers.  6 chicken nuggets.  6 oz (170 g) unsweetened dry cereal.  6 oz (170 g) plain fat-free yogurt or yogurt sweetened with artificial sweeteners.  8 oz (240 mL) milk.  8 oz (170 g) fresh fruit or one small piece of fruit.  24 oz (680 g) popped popcorn. Example of carbohydrate counting Sample meal  3 oz (85 g) chicken breast.  6 oz (170 g)   brown rice.  4 oz (113 g) corn.  8 oz (240 mL) milk.  8 oz (170 g) strawberries with sugar-free whipped topping. Carbohydrate calculation 1. Identify the foods that contain carbohydrates: ? Rice. ? Corn. ? Milk. ? Strawberries. 2. Calculate how many servings you have of each food: ? 2 servings rice. ? 1 serving corn. ? 1 serving milk. ? 1 serving strawberries. 3. Multiply each number of servings by 15 g: ? 2 servings rice x 15 g = 30 g. ? 1 serving corn x 15 g = 15 g. ? 1 serving milk x 15 g = 15 g. ? 1 serving  strawberries x 15 g = 15 g. 4. Add together all of the amounts to find the total grams of carbohydrates eaten: ? 30 g + 15 g + 15 g + 15 g = 75 g of carbohydrates total. Summary  Carbohydrate counting is a method of keeping track of how many carbohydrates you eat.  Eating carbohydrates naturally increases the amount of sugar (glucose) in the blood.  Counting how many carbohydrates you eat helps keep your blood glucose within normal limits, which helps you manage your diabetes.  A diet and nutrition specialist (registered dietitian) can help you make a meal plan and calculate how many carbohydrates you should have at each meal and snack. This information is not intended to replace advice given to you by your health care provider. Make sure you discuss any questions you have with your health care provider. Document Released: 07/20/2005 Document Revised: 02/11/2017 Document Reviewed: 01/01/2016 Elsevier Patient Education  2020 Elsevier Inc.  

## 2019-06-27 NOTE — Assessment & Plan Note (Signed)
Chronic, ongoing.  A1C recently 6.1%.  Remains well-controlled with Metformin 500 MG BID, continue this at this time.  Recommend checking BS at home daily.  Return in 3 months and obtain outpatient labs.

## 2019-06-27 NOTE — Progress Notes (Signed)
LMP  (LMP Unknown)    Subjective:    Patient ID: Julia Wood, female    DOB: [birth year], 58 y.o.   MRN: [id number]  HPI: Julia Wood is a 58 y.o. female  Chief Complaint  Patient presents with  . Diabetes  . Hyperlipidemia  . Hypertension    . This visit was completed via telephone due to the restrictions of the COVID-19 pandemic. All issues as above were discussed and addressed but no physical exam was performed. If it was felt that the patient should be evaluated in the office, they were directed there. The patient verbally consented to this visit. Patient was unable to complete an audio/visual visit due to Lack of equipment. Due to the catastrophic nature of the COVID-19 pandemic, this visit was done through audio contact only. . Location of the patient: home . Location of the provider: work . Those involved with this call:  . Provider: Marnee Guarneri, DNP . CMA: Yvonna Alanis, CMA . Front Desk/Registration: Jill Side  . Time spent on call: 15 minutes on the phone discussing health concerns. 10 minutes total spent in review of patient's record and preparation of their chart.  . I verified patient identity using two factors (patient name and date of birth). Patient consents verbally to being seen via telemedicine visit today.    DIABETES Last A1C 6.1%, initially placed on Metformin in February with A1C 7.2%.  Taking Metformin one tablet twice a day. Hypoglycemic episodes:no Polydipsia/polyuria: no Visual disturbance: no Chest pain: no Paresthesias: no Glucose Monitoring: yes  Accucheck frequency: Not Checking  Fasting glucose:   Post prandial:  Evening:  Before meals: Taking Insulin?: no  Long acting insulin:  Short acting insulin: Blood Pressure Monitoring: a few times a week Retinal Examination: Not up to Date Foot Exam: Up to Date Diabetic Education: Not Completed, did not attend Pneumovax: Not up to Date Influenza: Up to Date Aspirin: yes    HYPERTENSION / HYPERLIPIDEMIA Continues on HCTZ, Amlodipine, and Crestor. Satisfied with current treatment? yes Duration of hypertension: chronic BP monitoring frequency: a few times a week BP range: on average 120/70 range at home, had one SBP in 140 BP medication side effects: no Duration of hyperlipidemia: chronic Cholesterol medication side effects: no Cholesterol supplements: none Medication compliance: good compliance Aspirin: yes Recent stressors: no Recurrent headaches: no Visual changes: no Palpitations: no Dyspnea: no Chest pain: no Lower extremity edema: no Dizzy/lightheaded: no   Relevant past medical, surgical, family and social history reviewed and updated as indicated. Interim medical history since our last visit reviewed. Allergies and medications reviewed and updated.  Review of Systems  Constitutional: Negative for activity change, appetite change, diaphoresis, fatigue and fever.  Respiratory: Negative for cough, chest tightness and shortness of breath.   Cardiovascular: Negative for chest pain, palpitations and leg swelling.  Gastrointestinal: Negative for abdominal distention, abdominal pain, constipation, diarrhea, nausea and vomiting.  Endocrine: Negative for cold intolerance, heat intolerance, polydipsia, polyphagia and polyuria.  Neurological: Negative for dizziness, syncope, weakness, light-headedness, numbness and headaches.  Psychiatric/Behavioral: Negative.     Per HPI unless specifically indicated above     Objective:    LMP  (LMP Unknown)   Wt Readings from Last 3 Encounters:  12/29/18 174 lb (78.9 kg)  09/29/18 179 lb (81.2 kg)  09/23/18 178 lb 14.4 oz (81.1 kg)    Physical Exam   Unable to perform due to telephone visit only.  Diabetic Foot Exam - Simple   No data  filed     Results for orders placed or performed in visit on 05/05/19  Novel Coronavirus, NAA ([facility])   Specimen: Nasopharyngeal(NP) swabs in vial transport medium    NASOPHARYNGE  TESTING  Result Value Ref Range   SARS-CoV-2, NAA Not Detected Not Detected      Assessment & Plan:   Problem List Items Addressed This Visit      Cardiovascular and Mediastinum   Essential hypertension    Chronic, ongoing with home BP at goal.  Continue current regimen and adjust as needed.  Return in 3 months.  Obtain outpatient labs.      Relevant Medications   rosuvastatin (CRESTOR) 10 MG tablet   Other Relevant Orders   Comprehensive metabolic panel     Endocrine   Hyperlipidemia associated with type 2 diabetes mellitus (HCC)    Chronic, ongoing.  Continue current medication regimen and adjust as needed.  Lipid panel outpatient.  Refills sent.      Relevant Medications   rosuvastatin (CRESTOR) 10 MG tablet   Other Relevant Orders   Comprehensive metabolic panel   79m Lipid Panel   Type 2 diabetes mellitus without complication, without long-term current use of insulin (HCC) - Primary    Chronic, ongoing.  A1C recently 6.1%.  Remains well-controlled with Metformin 500 MG BID, continue this at this time.  Recommend checking BS at home daily.  Return in 3 months and obtain outpatient labs.      Relevant Medications   rosuvastatin (CRESTOR) 10 MG tablet   Other Relevant Orders   Bayer DCA Hb A1c Waived      I discussed the assessment and treatment plan with the patient. The patient was provided an opportunity to ask questions and all were answered. The patient agreed with the plan and demonstrated an understanding of the instructions.   The patient was advised to call back or seek an in-person evaluation if the symptoms worsen or if the condition fails to improve as anticipated.   I provided 15 minutes of time during this encounter.  Follow up plan: Return in about 3 months (around 09/27/2019) for T2DM, HTN/HLD.

## 2019-07-11 ENCOUNTER — Telehealth: Payer: Self-pay | Admitting: Nurse Practitioner

## 2019-07-11 NOTE — Telephone Encounter (Signed)
Pt. Is here in office stating that she is out of ibuprofen 600mg  that was ordered from a different provider.Please advise.

## 2019-07-11 NOTE — Telephone Encounter (Signed)
Pt understood and verbalized understanding.  

## 2019-07-11 NOTE — Telephone Encounter (Signed)
Please alert patient I would prefer she take Tylenol due to her high blood pressure, this is safer option for any arthritis pain or headaches.  Can take a max dose of 1000 MG three times a day as needed.  She can obtain this over the counter.  Can get store brand at lower cost.  Thank you.

## 2019-08-11 ENCOUNTER — Telehealth: Payer: Self-pay

## 2019-09-22 ENCOUNTER — Encounter: Payer: Self-pay | Admitting: Nurse Practitioner

## 2019-09-22 ENCOUNTER — Ambulatory Visit: Payer: BLUE CROSS/BLUE SHIELD | Admitting: Nurse Practitioner

## 2019-09-22 ENCOUNTER — Other Ambulatory Visit: Payer: Self-pay

## 2019-09-22 ENCOUNTER — Ambulatory Visit (INDEPENDENT_AMBULATORY_CARE_PROVIDER_SITE_OTHER): Payer: 59 | Admitting: Nurse Practitioner

## 2019-09-22 VITALS — BP 137/71 | HR 80 | Temp 98.6°F

## 2019-09-22 DIAGNOSIS — E119 Type 2 diabetes mellitus without complications: Secondary | ICD-10-CM | POA: Diagnosis not present

## 2019-09-22 DIAGNOSIS — E1169 Type 2 diabetes mellitus with other specified complication: Secondary | ICD-10-CM

## 2019-09-22 DIAGNOSIS — E669 Obesity, unspecified: Secondary | ICD-10-CM | POA: Diagnosis not present

## 2019-09-22 DIAGNOSIS — I10 Essential (primary) hypertension: Secondary | ICD-10-CM

## 2019-09-22 DIAGNOSIS — E1159 Type 2 diabetes mellitus with other circulatory complications: Secondary | ICD-10-CM

## 2019-09-22 DIAGNOSIS — E785 Hyperlipidemia, unspecified: Secondary | ICD-10-CM

## 2019-09-22 LAB — LIPID PANEL PICCOLO, WAIVED
Chol/HDL Ratio Piccolo,Waive: 3.6 mg/dL
Cholesterol Piccolo, Waived: 119 mg/dL (ref <200)
HDL Chol Piccolo, Waived: 33 mg/dL — ABNORMAL LOW (ref >59)
LDL Chol Calc Piccolo Waived: 42 mg/dL (ref <100)
Triglycerides Piccolo,Waived: 219 mg/dL — ABNORMAL HIGH (ref <150)
VLDL Chol Calc Piccolo,Waive: 44 mg/dL — ABNORMAL HIGH (ref <30)

## 2019-09-22 LAB — MICROALBUMIN, URINE WAIVED
Creatinine, Urine Waived: 100 mg/dL (ref 10–300)
Microalb, Ur Waived: 30 mg/L — ABNORMAL HIGH (ref 0–19)
Microalb/Creat Ratio: <30 mg/g (ref <30)

## 2019-09-22 LAB — BAYER DCA HB A1C WAIVED: HB A1C (BAYER DCA - WAIVED): 6.5 % (ref <7.0)

## 2019-09-22 NOTE — Assessment & Plan Note (Signed)
Recommend continued focus on healthy diet choices and regular physical activity (30 minutes 5 days a week).  

## 2019-09-22 NOTE — Assessment & Plan Note (Signed)
Chronic, ongoing.  A1C 6.5% today.  Remains well-controlled with Metformin 500 MG BID, continue this at this time.  Continue checking BS at least fasting once weekly and document. Return in 3 months.

## 2019-09-22 NOTE — Patient Instructions (Addendum)
Take Vitamin B12 1000 MCG daily  Carbohydrate Counting for Diabetes Mellitus, Adult  Carbohydrate counting is a method of keeping track of how many carbohydrates you eat. Eating carbohydrates naturally increases the amount of sugar (glucose) in the blood. Counting how many carbohydrates you eat helps keep your blood glucose within normal limits, which helps you manage your diabetes (diabetes mellitus). It is important to know how many carbohydrates you can safely have in each meal. This is different for every person. A diet and nutrition specialist (registered dietitian) can help you make a meal plan and calculate how many carbohydrates you should have at each meal and snack. Carbohydrates are found in the following foods:  Grains, such as breads and cereals.  Dried beans and soy products.  Starchy vegetables, such as potatoes, peas, and corn.  Fruit and fruit juices.  Milk and yogurt.  Sweets and snack foods, such as cake, cookies, candy, chips, and soft drinks. How do I count carbohydrates? There are two ways to count carbohydrates in food. You can use either of the methods or a combination of both. Reading "Nutrition Facts" on packaged food The "Nutrition Facts" list is included on the labels of almost all packaged foods and beverages in the [place] It includes:  The serving size.  Information about nutrients in each serving, including the grams (g) of carbohydrate per serving. To use the "Nutrition Facts":  Decide how many servings you will have.  Multiply the number of servings by the number of carbohydrates per serving.  The resulting number is the total amount of carbohydrates that you will be having. Learning standard serving sizes of other foods When you eat carbohydrate foods that are not packaged or do not include "Nutrition Facts" on the label, you need to measure the servings in order to count the amount of carbohydrates:  Measure the foods that you will eat with a food  scale or measuring cup, if needed.  Decide how many standard-size servings you will eat.  Multiply the number of servings by 15. Most carbohydrate-rich foods have about 15 g of carbohydrates per serving. ? For example, if you eat 8 oz (170 g) of strawberries, you will have eaten 2 servings and 30 g of carbohydrates (2 servings x 15 g = 30 g).  For foods that have more than one food mixed, such as soups and casseroles, you must count the carbohydrates in each food that is included. The following list contains standard serving sizes of common carbohydrate-rich foods. Each of these servings has about 15 g of carbohydrates:   hamburger bun or  English muffin.   oz (15 mL) syrup.   oz (14 g) jelly.  1 slice of bread.  1 six-inch tortilla.  3 oz (85 g) cooked rice or pasta.  4 oz (113 g) cooked dried beans.  4 oz (113 g) starchy vegetable, such as peas, corn, or potatoes.  4 oz (113 g) hot cereal.  4 oz (113 g) mashed potatoes or  of a large baked potato.  4 oz (113 g) canned or frozen fruit.  4 oz (120 mL) fruit juice.  4-6 crackers.  6 chicken nuggets.  6 oz (170 g) unsweetened dry cereal.  6 oz (170 g) plain fat-free yogurt or yogurt sweetened with artificial sweeteners.  8 oz (240 mL) milk.  8 oz (170 g) fresh fruit or one small piece of fruit.  24 oz (680 g) popped popcorn. Example of carbohydrate counting Sample meal  3 oz (85 g)  chicken breast.  6 oz (170 g) brown rice.  4 oz (113 g) corn.  8 oz (240 mL) milk.  8 oz (170 g) strawberries with sugar-free whipped topping. Carbohydrate calculation 1. Identify the foods that contain carbohydrates: ? Rice. ? Corn. ? Milk. ? Strawberries. 2. Calculate how many servings you have of each food: ? 2 servings rice. ? 1 serving corn. ? 1 serving milk. ? 1 serving strawberries. 3. Multiply each number of servings by 15 g: ? 2 servings rice x 15 g = 30 g. ? 1 serving corn x 15 g = 15 g. ? 1 serving milk  x 15 g = 15 g. ? 1 serving strawberries x 15 g = 15 g. 4. Add together all of the amounts to find the total grams of carbohydrates eaten: ? 30 g + 15 g + 15 g + 15 g = 75 g of carbohydrates total. Summary  Carbohydrate counting is a method of keeping track of how many carbohydrates you eat.  Eating carbohydrates naturally increases the amount of sugar (glucose) in the blood.  Counting how many carbohydrates you eat helps keep your blood glucose within normal limits, which helps you manage your diabetes.  A diet and nutrition specialist (registered dietitian) can help you make a meal plan and calculate how many carbohydrates you should have at each meal and snack. This information is not intended to replace advice given to you by your health care provider. Make sure you discuss any questions you have with your health care provider. Document Revised: 02/11/2017 Document Reviewed: 01/01/2016 Elsevier Patient Education  [place].

## 2019-09-22 NOTE — Progress Notes (Signed)
BP 137/71   Pulse 80   Temp 98.6 F (37 C) (Oral)   LMP  (LMP Unknown)   SpO2 98%    Subjective:    Patient ID: Julia Wood, female    DOB: [birth year], 59 y.o.   MRN: [id number]  HPI: Julia Wood is a 59 y.o. female  Chief Complaint  Patient presents with  . Diabetes    pt states she has eye exam scheduled, states she is starting to have some nerve issue with her feet   . Hyperlipidemia  . Hypertension   DIABETES Last A1C 6.1%, initially placed on Metformin in February with A1C 7.2%.  Taking Metformin one tablet twice a day. Hypoglycemic episodes:no Polydipsia/polyuria: no Visual disturbance: no Chest pain: no Paresthesias: no Glucose Monitoring: yes  Accucheck frequency: rarely  Fasting glucose: 80's  Post prandial:  Evening:  Before meals: Taking Insulin?: no  Long acting insulin:  Short acting insulin: Blood Pressure Monitoring: a few times a week Retinal Examination: Not up to Date Foot Exam: Up to Date Diabetic Education: Not Completed, did not attend Pneumovax: Not up to Date Influenza: Up to Date Aspirin: yes   HYPERTENSION / HYPERLIPIDEMIA Continues on HCTZ, Amlodipine, and Crestor. Satisfied with current treatment? yes Duration of hypertension: chronic BP monitoring frequency: a few times a week BP range: on average 120/70 range at home, had one SBP in 140 BP medication side effects: no Duration of hyperlipidemia: chronic Cholesterol medication side effects: no Cholesterol supplements: none Medication compliance: good compliance Aspirin: yes Recent stressors: no Recurrent headaches: no Visual changes: no Palpitations: no Dyspnea: no Chest pain: no Lower extremity edema: no Dizzy/lightheaded: no   Relevant past medical, surgical, family and social history reviewed and updated as indicated. Interim medical history since our last visit reviewed. Allergies and medications reviewed and updated.  Review of Systems  Constitutional:  Negative for activity change, appetite change, diaphoresis, fatigue and fever.  Respiratory: Negative for cough, chest tightness and shortness of breath.   Cardiovascular: Negative for chest pain, palpitations and leg swelling.  Gastrointestinal: Negative.   Endocrine: Negative for cold intolerance, heat intolerance, polydipsia, polyphagia and polyuria.  Neurological: Negative.   Psychiatric/Behavioral: Negative.     Per HPI unless specifically indicated above     Objective:    BP 137/71   Pulse 80   Temp 98.6 F (37 C) (Oral)   LMP  (LMP Unknown)   SpO2 98%   Wt Readings from Last 3 Encounters:  12/29/18 174 lb (78.9 kg)  09/29/18 179 lb (81.2 kg)  09/23/18 178 lb 14.4 oz (81.1 kg)    Physical Exam Vitals and nursing note reviewed.  Constitutional:      General: She is awake. She is not in acute distress.    Appearance: She is well-developed, well-groomed and overweight. She is not ill-appearing.  HENT:     Head: Normocephalic.     Right Ear: Hearing normal.     Left Ear: Hearing normal.  Eyes:     General: Lids are normal.        Right eye: No discharge.        Left eye: No discharge.     Conjunctiva/sclera: Conjunctivae normal.     Pupils: Pupils are equal, round, and reactive to light.  Neck:     Thyroid: No thyromegaly.     Vascular: No carotid bruit.  Cardiovascular:     Rate and Rhythm: Normal rate and regular rhythm.     Heart  sounds: Normal heart sounds. No murmur. No gallop.   Pulmonary:     Effort: Pulmonary effort is normal. No accessory muscle usage or respiratory distress.     Breath sounds: Normal breath sounds.  Abdominal:     General: Bowel sounds are normal.     Palpations: Abdomen is soft.  Musculoskeletal:     Cervical back: Normal range of motion and neck supple.     Right lower leg: No edema.     Left lower leg: No edema.  Skin:    General: Skin is warm and dry.  Neurological:     Mental Status: She is alert and oriented to person,  place, and time.  Psychiatric:        Attention and Perception: Attention normal.        Mood and Affect: Mood normal.        Behavior: Behavior normal. Behavior is cooperative.        Thought Content: Thought content normal.        Judgment: Judgment normal.      Diabetic Foot Exam - Simple   Simple Foot Form Visual Inspection No deformities, no ulcerations, no other skin breakdown bilaterally: Yes Sensation Testing Intact to touch and monofilament testing bilaterally: Yes Pulse Check Posterior Tibialis and Dorsalis pulse intact bilaterally: Yes Comments    Results for orders placed or performed in visit on 05/05/19  Novel Coronavirus, NAA ([facility])   Specimen: Nasopharyngeal(NP) swabs in vial transport medium   NASOPHARYNGE  TESTING  Result Value Ref Range   SARS-CoV-2, NAA Not Detected Not Detected      Assessment & Plan:   Problem List Items Addressed This Visit      Cardiovascular and Mediastinum   Hypertension associated with diabetes ([place])    Chronic, ongoing with BP at goal.  Urine ALB 30 and A:C <30.  Continue current regimen and adjust as needed, no current ACR or ARB may benefit from this in future for kidney protection.  Return in 3 months.  CMP today.      Relevant Orders   Bayer DCA Hb A1c Waived   Comprehensive metabolic panel   Microalbumin, Urine Waived     Endocrine   Hyperlipidemia associated with type 2 diabetes mellitus (HCC)    Chronic, ongoing.  Continue current medication regimen and adjust as needed.  LDL 42 and TCHOL 119 today, at goal.        Relevant Orders   Bayer DCA Hb A1c Waived   Comprehensive metabolic panel   Lipid Panel Piccolo, Waived   Type 2 diabetes mellitus without complication, without long-term current use of insulin (HCC) - Primary    Chronic, ongoing.  A1C 6.5% today.  Remains well-controlled with Metformin 500 MG BID, continue this at this time.  Continue checking BS at least fasting once weekly and document. Return in  3 months.      Relevant Orders   Bayer DCA Hb A1c Waived   Microalbumin, Urine Waived     Other   Obesity (BMI 30-39.9)    Recommend continued focus on healthy diet choices and regular physical activity (30 minutes 5 days a week).          Follow up plan: Return in about 3 months (around 12/20/2019) for T2DM, HTN/HLD.

## 2019-09-22 NOTE — Assessment & Plan Note (Signed)
Chronic, ongoing.  Continue current medication regimen and adjust as needed.  LDL 42 and TCHOL 119 today, at goal.

## 2019-09-22 NOTE — Assessment & Plan Note (Signed)
Chronic, ongoing with BP at goal.  Urine ALB 30 and A:C <30.  Continue current regimen and adjust as needed, no current ACR or ARB may benefit from this in future for kidney protection.  Return in 3 months.  CMP today.

## 2019-09-23 LAB — COMPREHENSIVE METABOLIC PANEL
ALT: 22 IU/L (ref 0–32)
AST: 16 IU/L (ref 0–40)
Albumin/Globulin Ratio: 1.9 (ref 1.2–2.2)
Albumin: 4.1 g/dL (ref 3.8–4.9)
Alkaline Phosphatase: 84 IU/L (ref 39–117)
BUN/Creatinine Ratio: 12 (ref 12–28)
BUN: 10 mg/dL (ref 8–27)
Bilirubin Total: <0.2 mg/dL (ref 0.0–1.2)
CO2: 27 mmol/L (ref 20–29)
Calcium: 9.4 mg/dL (ref 8.7–10.3)
Chloride: 99 mmol/L (ref 96–106)
Creatinine, Ser: 0.81 mg/dL (ref 0.57–1.00)
GFR calc Af Amer: 91 mL/min/{1.73_m2} (ref >59)
GFR calc non Af Amer: 79 mL/min/{1.73_m2} (ref >59)
Globulin, Total: 2.2 g/dL (ref 1.5–4.5)
Glucose: 247 mg/dL — ABNORMAL HIGH (ref 65–99)
Potassium: 3.9 mmol/L (ref 3.5–5.2)
Sodium: 140 mmol/L (ref 134–144)
Total Protein: 6.3 g/dL (ref 6.0–8.5)

## 2019-09-23 NOTE — Progress Notes (Signed)
Good morning.  Please let Julia Wood know her labs returned and kidney and liver function + cholesterol levels, remain in good range.  Continue current medications and the great work!!  Have a good day!!

## 2019-09-26 ENCOUNTER — Ambulatory Visit: Payer: Self-pay | Admitting: General Practice

## 2019-09-26 ENCOUNTER — Telehealth: Payer: Self-pay

## 2019-09-26 NOTE — Chronic Care Management (AMB) (Signed)
  Chronic Care Management   Outreach Note  09/26/2019 Name: Julia Wood MRN: [id number] DOB: November 29, 1960  Referred by: Venita Lick, NP Reason for referral : Care Coordination (Follow up on DM and glucose monitoring)   An unsuccessful telephone outreach was attempted today. The patient was referred to the case management team for assistance with care management and care coordination.   Follow Up Plan: A HIPPA compliant phone message was left for the patient providing contact information and requesting a return call.   Noreene Larsson RN, MSN, [facility] Mobile: [phone]

## 2019-10-20 ENCOUNTER — Ambulatory Visit: Payer: Self-pay | Admitting: General Practice

## 2019-10-20 ENCOUNTER — Telehealth: Payer: Self-pay | Admitting: General Practice

## 2019-10-20 DIAGNOSIS — E782 Mixed hyperlipidemia: Secondary | ICD-10-CM

## 2019-10-20 DIAGNOSIS — E1159 Type 2 diabetes mellitus with other circulatory complications: Secondary | ICD-10-CM

## 2019-10-20 DIAGNOSIS — E119 Type 2 diabetes mellitus without complications: Secondary | ICD-10-CM

## 2019-10-20 DIAGNOSIS — I152 Hypertension secondary to endocrine disorders: Secondary | ICD-10-CM

## 2019-10-20 NOTE — Patient Instructions (Signed)
Visit Information  Goals Addressed            This Visit's Progress   . COMPLETED: RN- "they showed me how to use the meter" (pt-stated)       Current Barriers:  Marland Kitchen Knowledge Deficits related to basic Diabetes pathophysiology and self care/management . Past barrier of blood glucose monitoring device use  Case Manager Clinical Goal(s):  Over the next 90 days, patient will demonstrate improved adherence to prescribed treatment plan for diabetes self care/management as evidenced by:  . daily monitoring and recording of CBG   Interventions:  . Provided education to patient about basic DM disease process . Reviewed medications with patient and discussed importance of medication adherence . Discussed plans with patient for ongoing care management follow up and provided patient with direct contact information for care management team . Advised patient, providing education and rationale, to check cbg QD and record, calling pcp for findings outside established parameters.   . Discussed rule of 15 and s/s of hypoglycemia  . Patient confirmed medications were ready to be picked up at [facility]. . Will continue to assist with diabetes management   Patient Self Care Activities:  . Attends all scheduled provider appointments  Please see past updates related to this goal by clicking on the "Past Updates" button in the selected goal      . RNCM: Pt-"I don't have an issue with low blood sugars"       CARE PLAN ENTRY (see longtitudinal plan of care for additional care plan information)  Current Barriers:  . Chronic Disease Management support, education, and care coordination needs related to HTN, HLD, and DMII  Clinical Goal(s) related to HTN, HLD, and DMII:  Over the next 120 days, patient will:  . Work with the care management team to address educational, disease management, and care coordination needs  . Begin or continue self health monitoring activities as directed today Measure and record  cbg (blood glucose) 1 times daily, Measure and record blood pressure 5 times per week, and adhere to a heart healthy/ADA diet . Call provider office for new or worsened signs and symptoms Blood glucose findings outside established parameters, Blood pressure findings outside established parameters, and New or worsened symptom related to HLD and other chronic conditions . Call care management team with questions or concerns . Verbalize basic understanding of patient centered plan of care established today  Interventions related to HTN, HLD, and DMII:  . Evaluation of current treatment plans and patient's adherence to plan as established by provider . Assessed patient understanding of disease states . Assessed patient's education and care coordination needs . Provided disease specific education to patient  . Collaborated with appropriate clinical care team members regarding patient needs  Patient Self Care Activities related to HTN, HLD, and DMII:  . Patient is unable to independently self-manage chronic health conditions  Initial goal documentation        Patient verbalizes understanding of instructions provided today.   The care management team will reach out to the patient again over the next 90 days.   Noreene Larsson RN, MSN, [facility] Mobile: [phone]

## 2019-10-20 NOTE — Chronic Care Management (AMB) (Signed)
Care Management   Follow Up Note   10/20/2019 Name: Julia Wood MRN: [id number] DOB: 1960/12/06  Referred by: Venita Lick, NP Reason for referral : Care Coordination (Care Coordination needs relarted to DM/HTN/HLD)   Julia Wood is a 59 y.o. year old female who is a primary care patient of Cannady, Barbaraann Faster, NP. The care management team was consulted for assistance with care management and care coordination needs.    Review of patient status, including review of consultants reports, relevant laboratory and other test results, and collaboration with appropriate care team members and the patient's provider was performed as part of comprehensive patient evaluation and provision of chronic care management services.    SDOH (Social Determinants of Health) assessments performed: Yes See Care Plan activities for detailed interventions related to [facility])     Advanced Directives: See Care Plan and Vynca application for related entries.   Goals Addressed            This Visit's Progress   . COMPLETED: RN- "they showed me how to use the meter" (pt-stated)       Current Barriers:  Marland Kitchen Knowledge Deficits related to basic Diabetes pathophysiology and self care/management . Past barrier of blood glucose monitoring device use  Case Manager Clinical Goal(s):  Over the next 90 days, patient will demonstrate improved adherence to prescribed treatment plan for diabetes self care/management as evidenced by:  . daily monitoring and recording of CBG   Interventions:  . Provided education to patient about basic DM disease process . Reviewed medications with patient and discussed importance of medication adherence . Discussed plans with patient for ongoing care management follow up and provided patient with direct contact information for care management team . Advised patient, providing education and rationale, to check cbg QD and record, calling pcp for findings outside established  parameters.   . Discussed rule of 15 and s/s of hypoglycemia  . Patient confirmed medications were ready to be picked up at [facility]. . Will continue to assist with diabetes management   Patient Self Care Activities:  . Attends all scheduled provider appointments  Please see past updates related to this goal by clicking on the "Past Updates" button in the selected goal      . RNCM: Pt-"I don't have an issue with low blood sugars"       CARE PLAN ENTRY (see longtitudinal plan of care for additional care plan information)  Current Barriers:  . Chronic Disease Management support, education, and care coordination needs related to HTN, HLD, and DMII  Clinical Goal(s) related to HTN, HLD, and DMII:  Over the next 120 days, patient will:  . Work with the care management team to address educational, disease management, and care coordination needs  . Begin or continue self health monitoring activities as directed today Measure and record cbg (blood glucose) 1 times daily, Measure and record blood pressure 5 times per week, and adhere to a heart healthy/ADA diet . Call provider office for new or worsened signs and symptoms Blood glucose findings outside established parameters, Blood pressure findings outside established parameters, and New or worsened symptom related to HLD and other chronic conditions . Call care management team with questions or concerns . Verbalize basic understanding of patient centered plan of care established today  Interventions related to HTN, HLD, and DMII:  . Evaluation of current treatment plans and patient's adherence to plan as established by provider . Assessed patient understanding of disease states . Assessed patient's  education and care coordination needs . Provided disease specific education to patient  . Collaborated with appropriate clinical care team members regarding patient needs  Patient Self Care Activities related to HTN, HLD, and DMII:  . Patient is  unable to independently self-manage chronic health conditions  Initial goal documentation         The care management team will reach out to the patient again over the next 90 days.   Noreene Larsson RN, MSN, [facility] Mobile: [phone]

## 2019-11-08 ENCOUNTER — Telehealth: Payer: Self-pay

## 2019-12-15 ENCOUNTER — Encounter: Payer: Self-pay | Admitting: Nurse Practitioner

## 2019-12-15 ENCOUNTER — Telehealth: Payer: Self-pay | Admitting: Nurse Practitioner

## 2019-12-15 NOTE — Telephone Encounter (Signed)
I highly recommend she obtain Covid 19 vaccine, this will not interact with any of her current medications.

## 2019-12-15 NOTE — Telephone Encounter (Signed)
Routing to provider  

## 2019-12-15 NOTE — Telephone Encounter (Signed)
Patient calling to inquire if Julia Wood would recommend patient get covid-19 vaccine. Patient specifically inquiring if the vaccine could interact with any of her current medications.

## 2019-12-15 NOTE — Telephone Encounter (Signed)
Called and left a detailed message for patient letting her know Jolene's recommendation.

## 2019-12-18 ENCOUNTER — Ambulatory Visit (INDEPENDENT_AMBULATORY_CARE_PROVIDER_SITE_OTHER): Payer: 59 | Admitting: Nurse Practitioner

## 2019-12-18 ENCOUNTER — Encounter: Payer: Self-pay | Admitting: Nurse Practitioner

## 2019-12-18 DIAGNOSIS — E669 Obesity, unspecified: Secondary | ICD-10-CM

## 2019-12-18 DIAGNOSIS — E1169 Type 2 diabetes mellitus with other specified complication: Secondary | ICD-10-CM | POA: Diagnosis not present

## 2019-12-18 DIAGNOSIS — R809 Proteinuria, unspecified: Secondary | ICD-10-CM

## 2019-12-18 DIAGNOSIS — E785 Hyperlipidemia, unspecified: Secondary | ICD-10-CM

## 2019-12-18 DIAGNOSIS — E1129 Type 2 diabetes mellitus with other diabetic kidney complication: Secondary | ICD-10-CM

## 2019-12-18 DIAGNOSIS — I10 Essential (primary) hypertension: Secondary | ICD-10-CM

## 2019-12-18 DIAGNOSIS — E1159 Type 2 diabetes mellitus with other circulatory complications: Secondary | ICD-10-CM | POA: Diagnosis not present

## 2019-12-18 NOTE — Assessment & Plan Note (Signed)
Chronic, ongoing.  A1C 6.5% last visit.  Remains well-controlled with Metformin 500 MG BID, continue this at this time.  Continue checking BS at least fasting once weekly and document. Will obtain outpatient A1C.  Return in 3 months.

## 2019-12-18 NOTE — Assessment & Plan Note (Signed)
Recommended eating smaller high protein, low fat meals more frequently and exercising 30 mins a day 5 times a week with a goal of 10-15lb weight loss in the next 3 months. Patient voiced their understanding and motivation to adhere to these recommendations.  

## 2019-12-18 NOTE — Progress Notes (Signed)
LMP  (LMP Unknown)    Subjective:    Patient ID: Julia Wood, female    DOB: [birth year], 59 y.o.   MRN: [id number]  HPI: Julia Wood is a 59 y.o. female  Chief Complaint  Patient presents with  . Diabetes  . Hyperlipidemia  . Hypertension     . This visit was completed via telephone due to the restrictions of the COVID-19 pandemic. All issues as above were discussed and addressed but no physical exam was performed. If it was felt that the patient should be evaluated in the office, they were directed there. The patient verbally consented to this visit. Patient was unable to complete an audio/visual visit due to Technical difficulties,Lack of internet. Due to the catastrophic nature of the COVID-19 pandemic, this visit was done through audio contact only. . Location of the patient: home . Location of the provider: work . Those involved with this call:  . Provider: Marnee Guarneri, DNP . CMA: Yvonna Alanis, CMA . Front Desk/Registration: Don Perking  . Time spent on call: 15 minutes on the phone discussing health concerns. 10 minutes total spent in review of patient's record and preparation of their chart.  . I verified patient identity using two factors (patient name and date of birth). Patient consents verbally to being seen via telemedicine visit today.     DIABETES Last A1C 6.5%, initially placed on Metformin in February with A1C 7.2%.  Taking Metformin one tablet twice a day.  Spoke to patient about Covid vaccine and have recommend she obtain this, educated her on vaccine. Hypoglycemic episodes:no Polydipsia/polyuria: no Visual disturbance: no Chest pain: no Paresthesias: no Glucose Monitoring: yes  Accucheck frequency: rarely  Fasting glucose: 80 -107  Post prandial:  Evening:  Before meals: Taking Insulin?: no  Long acting insulin:  Short acting insulin: Blood Pressure Monitoring: a few times a week Retinal Examination: Not up to Date Foot  Exam: Up to Date Diabetic Education: Not Completed, did not attend Pneumovax: Not up to Date Influenza: Up to Date Aspirin: yes   HYPERTENSION / HYPERLIPIDEMIA Continues on HCTZ, Amlodipine, and Crestor. Satisfied with current treatment? yes Duration of hypertension: chronic BP monitoring frequency: a few times a week BP range: on average 120/70 range at home, had one SBP in 140 BP medication side effects: no Duration of hyperlipidemia: chronic Cholesterol medication side effects: no Cholesterol supplements: none Medication compliance: good compliance Aspirin: yes Recent stressors: no Recurrent headaches: no Visual changes: no Palpitations: no Dyspnea: no Chest pain: no Lower extremity edema: no Dizzy/lightheaded: no   Relevant past medical, surgical, family and social history reviewed and updated as indicated. Interim medical history since our last visit reviewed. Allergies and medications reviewed and updated.  Review of Systems  Constitutional: Negative for activity change, appetite change, diaphoresis, fatigue and fever.  Respiratory: Negative for cough, chest tightness and shortness of breath.   Cardiovascular: Negative for chest pain, palpitations and leg swelling.  Gastrointestinal: Negative.   Endocrine: Negative for cold intolerance, heat intolerance, polydipsia, polyphagia and polyuria.  Neurological: Negative.   Psychiatric/Behavioral: Negative.     Per HPI unless specifically indicated above     Objective:    LMP  (LMP Unknown)   Wt Readings from Last 3 Encounters:  12/29/18 174 lb (78.9 kg)  09/29/18 179 lb (81.2 kg)  09/23/18 178 lb 14.4 oz (81.1 kg)    Physical Exam   Unable to perform due to telephone visit only.  Results for orders placed or  performed in visit on 09/22/19  Bayer DCA Hb A1c Waived  Result Value Ref Range   HB A1C (BAYER DCA - WAIVED) 6.5 <7.0 %  Comprehensive metabolic panel  Result Value Ref Range   Glucose 247 (H) 65 - 99  mg/dL   BUN 10 8 - 27 mg/dL   Creatinine, Ser 0.81 0.57 - 1.00 mg/dL   GFR calc non Af Amer 79 >59 mL/min/1.73   GFR calc Af Amer 91 >59 mL/min/1.73   BUN/Creatinine Ratio 12 12 - 28   Sodium 140 134 - 144 mmol/L   Potassium 3.9 3.5 - 5.2 mmol/L   Chloride 99 96 - 106 mmol/L   CO2 27 20 - 29 mmol/L   Calcium 9.4 8.7 - 10.3 mg/dL   Total Protein 6.3 6.0 - 8.5 g/dL   Albumin 4.1 3.8 - 4.9 g/dL   Globulin, Total 2.2 1.5 - 4.5 g/dL   Albumin/Globulin Ratio 1.9 1.2 - 2.2   Bilirubin Total <0.2 0.0 - 1.2 mg/dL   Alkaline Phosphatase 84 39 - 117 IU/L   AST 16 0 - 40 IU/L   ALT 22 0 - 32 IU/L  Lipid Panel Piccolo, Waived  Result Value Ref Range   Cholesterol Piccolo, Waived 119 <200 mg/dL   HDL Chol Piccolo, Waived 33 (L) >59 mg/dL   Triglycerides Piccolo,Waived 219 (H) <150 mg/dL   Chol/HDL Ratio Piccolo,Waive 3.6 mg/dL   LDL Chol Calc Piccolo Waived 42 <100 mg/dL   VLDL Chol Calc Piccolo,Waive 44 (H) <30 mg/dL  Microalbumin, Urine Waived  Result Value Ref Range   Microalb, Ur Waived 30 (H) 0 - 19 mg/L   Creatinine, Urine Waived 100 10 - 300 mg/dL   Microalb/Creat Ratio <30 <30 mg/g      Assessment & Plan:   Problem List Items Addressed This Visit      Cardiovascular and Mediastinum   Hypertension associated with diabetes ([place])    Chronic, ongoing with BP at goal.  Urine ALB 30 and A:C <30 last visit.  Continue current regimen and adjust as needed, no current ACR or ARB may benefit from this in future for kidney protection discussed with patient and next visit will consider discontinuing Amlodipine and starting ACE/ARB.  Return in 3 months.          Endocrine   Hyperlipidemia associated with type 2 diabetes mellitus (HCC)    Chronic, ongoing.  Continue current medication regimen and adjust as needed.  LDL 42 and TCHOL 119 recent visit, at goal.        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  A1C 6.5% last visit.  Remains  well-controlled with Metformin 500 MG BID, continue this at this time.  Continue checking BS at least fasting once weekly and document. Will obtain outpatient A1C.  Return in 3 months.      Relevant Orders   Bayer DCA Hb A1c Waived     Other   Obesity (BMI 30-39.9)    Recommended eating smaller high protein, low fat meals more frequently and exercising 30 mins a day 5 times a week with a goal of 10-15lb weight loss in the next 3 months. Patient voiced their understanding and motivation to adhere to these recommendations.         I discussed the assessment and treatment plan with the patient. The patient was provided an opportunity to ask questions and all were answered. The patient agreed with the plan and demonstrated  an understanding of the instructions.   The patient was advised to call back or seek an in-person evaluation if the symptoms worsen or if the condition fails to improve as anticipated.   I provided 15+ minutes of time during this encounter.  Follow up plan: Return in about 3 months (around 03/19/2020) for T2DM, HTN/ HLD.

## 2019-12-18 NOTE — Patient Instructions (Signed)
Carbohydrate Counting for Diabetes Mellitus, Adult  Carbohydrate counting is a method of keeping track of how many carbohydrates you eat. Eating carbohydrates naturally increases the amount of sugar (glucose) in the blood. Counting how many carbohydrates you eat helps keep your blood glucose within normal limits, which helps you manage your diabetes (diabetes mellitus). It is important to know how many carbohydrates you can safely have in each meal. This is different for every person. A diet and nutrition specialist (registered dietitian) can help you make a meal plan and calculate how many carbohydrates you should have at each meal and snack. Carbohydrates are found in the following foods:  Grains, such as breads and cereals.  Dried beans and soy products.  Starchy vegetables, such as potatoes, peas, and corn.  Fruit and fruit juices.  Milk and yogurt.  Sweets and snack foods, such as cake, cookies, candy, chips, and soft drinks. How do I count carbohydrates? There are two ways to count carbohydrates in food. You can use either of the methods or a combination of both. Reading "Nutrition Facts" on packaged food The "Nutrition Facts" list is included on the labels of almost all packaged foods and beverages in the [place] It includes:  The serving size.  Information about nutrients in each serving, including the grams (g) of carbohydrate per serving. To use the "Nutrition Facts":  Decide how many servings you will have.  Multiply the number of servings by the number of carbohydrates per serving.  The resulting number is the total amount of carbohydrates that you will be having. Learning standard serving sizes of other foods When you eat carbohydrate foods that are not packaged or do not include "Nutrition Facts" on the label, you need to measure the servings in order to count the amount of carbohydrates:  Measure the foods that you will eat with a food scale or measuring cup, if  needed.  Decide how many standard-size servings you will eat.  Multiply the number of servings by 15. Most carbohydrate-rich foods have about 15 g of carbohydrates per serving. ? For example, if you eat 8 oz (170 g) of strawberries, you will have eaten 2 servings and 30 g of carbohydrates (2 servings x 15 g = 30 g).  For foods that have more than one food mixed, such as soups and casseroles, you must count the carbohydrates in each food that is included. The following list contains standard serving sizes of common carbohydrate-rich foods. Each of these servings has about 15 g of carbohydrates:   hamburger bun or  English muffin.   oz (15 mL) syrup.   oz (14 g) jelly.  1 slice of bread.  1 six-inch tortilla.  3 oz (85 g) cooked rice or pasta.  4 oz (113 g) cooked dried beans.  4 oz (113 g) starchy vegetable, such as peas, corn, or potatoes.  4 oz (113 g) hot cereal.  4 oz (113 g) mashed potatoes or  of a large baked potato.  4 oz (113 g) canned or frozen fruit.  4 oz (120 mL) fruit juice.  4-6 crackers.  6 chicken nuggets.  6 oz (170 g) unsweetened dry cereal.  6 oz (170 g) plain fat-free yogurt or yogurt sweetened with artificial sweeteners.  8 oz (240 mL) milk.  8 oz (170 g) fresh fruit or one small piece of fruit.  24 oz (680 g) popped popcorn. Example of carbohydrate counting Sample meal  3 oz (85 g) chicken breast.  6 oz (170 g)   brown rice.  4 oz (113 g) corn.  8 oz (240 mL) milk.  8 oz (170 g) strawberries with sugar-free whipped topping. Carbohydrate calculation 1. Identify the foods that contain carbohydrates: ? Rice. ? Corn. ? Milk. ? Strawberries. 2. Calculate how many servings you have of each food: ? 2 servings rice. ? 1 serving corn. ? 1 serving milk. ? 1 serving strawberries. 3. Multiply each number of servings by 15 g: ? 2 servings rice x 15 g = 30 g. ? 1 serving corn x 15 g = 15 g. ? 1 serving milk x 15 g = 15 g. ? 1  serving strawberries x 15 g = 15 g. 4. Add together all of the amounts to find the total grams of carbohydrates eaten: ? 30 g + 15 g + 15 g + 15 g = 75 g of carbohydrates total. Summary  Carbohydrate counting is a method of keeping track of how many carbohydrates you eat.  Eating carbohydrates naturally increases the amount of sugar (glucose) in the blood.  Counting how many carbohydrates you eat helps keep your blood glucose within normal limits, which helps you manage your diabetes.  A diet and nutrition specialist (registered dietitian) can help you make a meal plan and calculate how many carbohydrates you should have at each meal and snack. This information is not intended to replace advice given to you by your health care provider. Make sure you discuss any questions you have with your health care provider. Document Revised: 02/11/2017 Document Reviewed: 01/01/2016 Elsevier Patient Education  2020 [employer]  

## 2019-12-18 NOTE — Assessment & Plan Note (Signed)
Chronic, ongoing with BP at goal.  Urine ALB 30 and A:C <30 last visit.  Continue current regimen and adjust as needed, no current ACR or ARB may benefit from this in future for kidney protection discussed with patient and next visit will consider discontinuing Amlodipine and starting ACE/ARB.  Return in 3 months.

## 2019-12-18 NOTE — Assessment & Plan Note (Signed)
Chronic, ongoing.  Continue current medication regimen and adjust as needed.  LDL 42 and TCHOL 119 recent visit, at goal.

## 2019-12-27 ENCOUNTER — Other Ambulatory Visit: Payer: 59

## 2020-01-10 ENCOUNTER — Other Ambulatory Visit: Payer: Self-pay | Admitting: Nurse Practitioner

## 2020-01-10 ENCOUNTER — Other Ambulatory Visit: Payer: 59

## 2020-01-10 ENCOUNTER — Other Ambulatory Visit: Payer: Self-pay

## 2020-01-10 DIAGNOSIS — R809 Proteinuria, unspecified: Secondary | ICD-10-CM

## 2020-01-10 LAB — BAYER DCA HB A1C WAIVED: HB A1C (BAYER DCA - WAIVED): 6.8 % (ref <7.0)

## 2020-01-10 MED ORDER — HYDROCHLOROTHIAZIDE 25 MG PO TABS: 25.0000 mg | ORAL_TABLET | Freq: Every day | ORAL | 4 refills | Status: DC

## 2020-01-10 MED ORDER — ROSUVASTATIN CALCIUM 10 MG PO TABS: 10.0000 mg | ORAL_TABLET | Freq: Every day | ORAL | 4 refills | Status: DC

## 2020-01-10 MED ORDER — METFORMIN HCL 500 MG PO TABS: 500.0000 mg | ORAL_TABLET | Freq: Two times a day (BID) | ORAL | 4 refills | Status: DC

## 2020-01-10 MED ORDER — AMLODIPINE BESYLATE 5 MG PO TABS: 5.0000 mg | ORAL_TABLET | Freq: Every day | ORAL | 4 refills | Status: DC

## 2020-01-10 NOTE — Progress Notes (Signed)
Please let Julia Wood know her A1C is 6.8%, continues to be at goal below 7%.  Continue her Metformin as it is offering benefit.  Have a great day!!

## 2020-01-23 ENCOUNTER — Telehealth: Payer: Self-pay | Admitting: General Practice

## 2020-01-23 ENCOUNTER — Ambulatory Visit: Payer: Self-pay | Admitting: General Practice

## 2020-01-23 DIAGNOSIS — E1169 Type 2 diabetes mellitus with other specified complication: Secondary | ICD-10-CM

## 2020-01-23 DIAGNOSIS — R809 Proteinuria, unspecified: Secondary | ICD-10-CM

## 2020-01-23 DIAGNOSIS — I152 Hypertension secondary to endocrine disorders: Secondary | ICD-10-CM

## 2020-01-23 NOTE — Patient Instructions (Signed)
Visit Information  Goals Addressed            This Visit's Progress   . RNCM: Pt-"I don't have an issue with low blood sugars"       CARE PLAN ENTRY (see longtitudinal plan of care for additional care plan information)  Current Barriers:  . Chronic Disease Management support, education, and care coordination needs related to HTN, HLD, and DMII  Clinical Goal(s) related to HTN, HLD, and DMII:  Over the next 120 days, patient will:  . Work with the care management team to address educational, disease management, and care coordination needs  . Begin or continue self health monitoring activities as directed today Measure and record cbg (blood glucose) 1 times daily, Measure and record blood pressure 5 times per week, and adhere to a heart healthy/ADA diet . Call provider office for new or worsened signs and symptoms Blood glucose findings outside established parameters, Blood pressure findings outside established parameters, and New or worsened symptom related to HLD and other chronic conditions . Call care management team with questions or concerns . Verbalize basic understanding of patient centered plan of care established today  Interventions related to HTN, HLD, and DMII:  . Evaluation of current treatment plans and patient's adherence to plan as established by provider.  The patient endorses compliance with plan of care . Assessed patient understanding of disease states.  The patient has a good understand of her chronic conditions. The patients most recent hemoglobin A1C was 6.5.  The patient checks her blood sugars regularly.  . Assessed patient's education and care coordination needs.  Denies any needs at this time . Provided disease specific education to patient  . Collaborated with appropriate clinical care team members regarding patient needs . Evaluation of upcoming appointments. Sees pcp 03-29-2020.  Patient Self Care Activities related to HTN, HLD, and DMII:  . Patient is  unable to independently self-manage chronic health conditions  Please see past updates related to this goal by clicking on the "Past Updates" button in the selected goal         Patient verbalizes understanding of instructions provided today.   The care management team will reach out to the patient again over the next 60 to 90 days.   Noreene Larsson RN, MSN, [facility] Mobile: [phone]

## 2020-01-23 NOTE — Chronic Care Management (AMB) (Signed)
  Care Management   Follow Up Note   01/23/2020 Name: Julia Wood MRN: [id number] DOB: 1960/12/05  Referred by: Venita Lick, NP Reason for referral : Care Coordination (Follow up: RNCM Chronic Disease management and care coordination needs)   Julia Wood is a 59 y.o. year old female who is a primary care patient of Cannady, Barbaraann Faster, NP. The care management team was consulted for assistance with care management and care coordination needs.    Review of patient status, including review of consultants reports, relevant laboratory and other test results, and collaboration with appropriate care team members and the patient's provider was performed as part of comprehensive patient evaluation and provision of chronic care management services.    SDOH (Social Determinants of Health) assessments performed: Yes See Care Plan activities for detailed interventions related to [facility])     Advanced Directives: See Care Plan and Vynca application for related entries.   Goals Addressed            This Visit's Progress   . RNCM: Pt-"I don't have an issue with low blood sugars"       CARE PLAN ENTRY (see longtitudinal plan of care for additional care plan information)  Current Barriers:  . Chronic Disease Management support, education, and care coordination needs related to HTN, HLD, and DMII  Clinical Goal(s) related to HTN, HLD, and DMII:  Over the next 120 days, patient will:  . Work with the care management team to address educational, disease management, and care coordination needs  . Begin or continue self health monitoring activities as directed today Measure and record cbg (blood glucose) 1 times daily, Measure and record blood pressure 5 times per week, and adhere to a heart healthy/ADA diet . Call provider office for new or worsened signs and symptoms Blood glucose findings outside established parameters, Blood pressure findings outside established parameters, and New or  worsened symptom related to HLD and other chronic conditions . Call care management team with questions or concerns . Verbalize basic understanding of patient centered plan of care established today  Interventions related to HTN, HLD, and DMII:  . Evaluation of current treatment plans and patient's adherence to plan as established by provider.  The patient endorses compliance with plan of care . Assessed patient understanding of disease states.  The patient has a good understand of her chronic conditions. The patients most recent hemoglobin A1C was 6.5.  The patient checks her blood sugars regularly.  . Assessed patient's education and care coordination needs.  Denies any needs at this time . Provided disease specific education to patient  . Collaborated with appropriate clinical care team members regarding patient needs . Evaluation of upcoming appointments. Sees pcp 03-29-2020.  Patient Self Care Activities related to HTN, HLD, and DMII:  . Patient is unable to independently self-manage chronic health conditions  Please see past updates related to this goal by clicking on the "Past Updates" button in the selected goal          The care management team will reach out to the patient again over the next 60 to 90 days.   Noreene Larsson RN, MSN, [facility] Mobile: [phone]

## 2020-03-29 ENCOUNTER — Ambulatory Visit: Payer: 59 | Admitting: Nurse Practitioner

## 2020-04-02 ENCOUNTER — Ambulatory Visit: Payer: Self-pay | Admitting: General Practice

## 2020-04-02 ENCOUNTER — Telehealth: Payer: Self-pay | Admitting: General Practice

## 2020-04-02 DIAGNOSIS — R809 Proteinuria, unspecified: Secondary | ICD-10-CM

## 2020-04-02 DIAGNOSIS — I152 Hypertension secondary to endocrine disorders: Secondary | ICD-10-CM

## 2020-04-02 DIAGNOSIS — E785 Hyperlipidemia, unspecified: Secondary | ICD-10-CM

## 2020-04-02 NOTE — Chronic Care Management (AMB) (Signed)
Care Management   Follow Up Note   04/02/2020 Name: Julia Wood MRN: [id number] DOB: 1960/11/10  Referred by: Venita Lick, NP Reason for referral : Care Coordination (RNCM follow up call for Chronic Disease Managment and Care Coordination Needs )   Julia Wood is a 59 y.o. year old female who is a primary care patient of Cannady, Julia Faster, NP. The care management team was consulted for assistance with care management and care coordination needs.    Review of patient status, including review of consultants reports, relevant laboratory and other test results, and collaboration with appropriate care team members and the patient's provider was performed as part of comprehensive patient evaluation and provision of chronic care management services.    SDOH (Social Determinants of Health) assessments performed: Yes See Care Plan activities for detailed interventions related to [facility])     Advanced Directives: See Care Plan and Vynca application for related entries.   Goals Addressed            This Visit's Progress   . RNCM: Pt-"I don't have an issue with low blood sugars"       CARE PLAN ENTRY (see longtitudinal plan of care for additional care plan information)  Current Barriers:  . Chronic Disease Management support, education, and care coordination needs related to HTN, HLD, and DMII  Clinical Goal(s) related to HTN, HLD, and DMII:  Over the next 120 days, patient will:  . Work with the care management team to address educational, disease management, and care coordination needs  . Begin or continue self health monitoring activities as directed today Measure and record cbg (blood glucose) 1 times daily, Measure and record blood pressure 5 times per week, and adhere to a heart healthy/ADA diet . Call provider office for new or worsened signs and symptoms Blood glucose findings outside established parameters, Blood pressure findings outside established parameters, and  New or worsened symptom related to HLD and other chronic conditions . Call care management team with questions or concerns . Verbalize basic understanding of patient centered plan of care established today  Interventions related to HTN, HLD, and DMII:  . Evaluation of current treatment plans and patient's adherence to plan as established by provider.  The patient endorses compliance with plan of care . Assessed patient understanding of disease states.  The patient has a good understand of her chronic conditions. The patients most recent hemoglobin A1C was 6.5.  The patient checks her blood sugars regularly. 04-02-2020: The patient states that her blood sugars have been in 130's. The patient denies any issues with her diabetes. . Assessed patient's education and care coordination needs.  Denies any needs at this time . Provided disease specific education to patient.  04-02-2020: The patient endorses a heart healthy/ADA diet. States her appetite is good and she is staying hydrated.  Working and getting adequate rest.  . Collaborated with appropriate clinical care team members regarding patient needs.  Knows about LCSW and pharm D. No expressed needs at this time.  . Evaluation of upcoming appointments. Sees pcp 04-05-2020.  Patient Self Care Activities related to HTN, HLD, and DMII:  . Patient is unable to independently self-manage chronic health conditions  Please see past updates related to this goal by clicking on the "Past Updates" button in the selected goal          Telephone follow up appointment with care management team member scheduled for: 07-16-2020 at 230pm  [place], MSN, [facility]  [facility] Mobile: [phone]

## 2020-04-02 NOTE — Patient Instructions (Signed)
Visit Information  Goals Addressed            This Visit's Progress    RNCM: Pt-"I don't have an issue with low blood sugars"       CARE PLAN ENTRY (see longtitudinal plan of care for additional care plan information)  Current Barriers:   Chronic Disease Management support, education, and care coordination needs related to HTN, HLD, and DMII  Clinical Goal(s) related to HTN, HLD, and DMII:  Over the next 120 days, patient will:   Work with the care management team to address educational, disease management, and care coordination needs   Begin or continue self health monitoring activities as directed today Measure and record cbg (blood glucose) 1 times daily, Measure and record blood pressure 5 times per week, and adhere to a heart healthy/ADA diet  Call provider office for new or worsened signs and symptoms Blood glucose findings outside established parameters, Blood pressure findings outside established parameters, and New or worsened symptom related to HLD and other chronic conditions  Call care management team with questions or concerns  Verbalize basic understanding of patient centered plan of care established today  Interventions related to HTN, HLD, and DMII:   Evaluation of current treatment plans and patient's adherence to plan as established by provider.  The patient endorses compliance with plan of care  Assessed patient understanding of disease states.  The patient has a good understand of her chronic conditions. The patients most recent hemoglobin A1C was 6.5.  The patient checks her blood sugars regularly. 04-02-2020: The patient states that her blood sugars have been in 130's. The patient denies any issues with her diabetes.  Assessed patient's education and care coordination needs.  Denies any needs at this time  Provided disease specific education to patient.  04-02-2020: The patient endorses a heart healthy/ADA diet. States her appetite is good and she is staying  hydrated.  Working and getting adequate rest.   Collaborated with appropriate clinical care team members regarding patient needs.  Knows about LCSW and pharm D. No expressed needs at this time.   Evaluation of upcoming appointments. Sees pcp 04-05-2020.  Patient Self Care Activities related to HTN, HLD, and DMII:   Patient is unable to independently self-manage chronic health conditions  Please see past updates related to this goal by clicking on the "Past Updates" button in the selected goal         Patient verbalizes understanding of instructions provided today.   Telephone follow up appointment with care management team member scheduled for: 04-05-2020 at 230pm  [place], MSN, [facility] Mobile: [phone]

## 2020-04-05 ENCOUNTER — Ambulatory Visit (INDEPENDENT_AMBULATORY_CARE_PROVIDER_SITE_OTHER): Payer: 59 | Admitting: Nurse Practitioner

## 2020-04-05 ENCOUNTER — Other Ambulatory Visit: Payer: Self-pay

## 2020-04-05 ENCOUNTER — Encounter: Payer: Self-pay | Admitting: Nurse Practitioner

## 2020-04-05 VITALS — BP 133/84 | HR 84 | Temp 99.6°F | Wt 176.0 lb

## 2020-04-05 DIAGNOSIS — Z1231 Encounter for screening mammogram for malignant neoplasm of breast: Secondary | ICD-10-CM

## 2020-04-05 DIAGNOSIS — R809 Proteinuria, unspecified: Secondary | ICD-10-CM

## 2020-04-05 DIAGNOSIS — E785 Hyperlipidemia, unspecified: Secondary | ICD-10-CM

## 2020-04-05 DIAGNOSIS — E669 Obesity, unspecified: Secondary | ICD-10-CM

## 2020-04-05 DIAGNOSIS — E1159 Type 2 diabetes mellitus with other circulatory complications: Secondary | ICD-10-CM

## 2020-04-05 DIAGNOSIS — I10 Essential (primary) hypertension: Secondary | ICD-10-CM

## 2020-04-05 DIAGNOSIS — E1169 Type 2 diabetes mellitus with other specified complication: Secondary | ICD-10-CM | POA: Diagnosis not present

## 2020-04-05 DIAGNOSIS — Z1211 Encounter for screening for malignant neoplasm of colon: Secondary | ICD-10-CM

## 2020-04-05 DIAGNOSIS — E1129 Type 2 diabetes mellitus with other diabetic kidney complication: Secondary | ICD-10-CM

## 2020-04-05 LAB — BAYER DCA HB A1C WAIVED: HB A1C (BAYER DCA - WAIVED): 6.6 % (ref <7.0)

## 2020-04-05 MED ORDER — METFORMIN HCL 500 MG PO TABS
500.0000 mg | ORAL_TABLET | Freq: Two times a day (BID) | ORAL | 4 refills | Status: DC
Start: 2020-04-05 — End: 2021-04-16

## 2020-04-05 MED ORDER — HYDROCHLOROTHIAZIDE 25 MG PO TABS
25.0000 mg | ORAL_TABLET | Freq: Every day | ORAL | 4 refills | Status: DC
Start: 2020-04-05 — End: 2021-03-09

## 2020-04-05 MED ORDER — ROSUVASTATIN CALCIUM 10 MG PO TABS
10.0000 mg | ORAL_TABLET | Freq: Every day | ORAL | 4 refills | Status: DC
Start: 2020-04-05 — End: 2021-04-16

## 2020-04-05 MED ORDER — GABAPENTIN 300 MG PO CAPS: 300.0000 mg | ORAL_CAPSULE | Freq: Three times a day (TID) | ORAL | 3 refills | Status: DC

## 2020-04-05 MED ORDER — AMLODIPINE BESYLATE 5 MG PO TABS
5.0000 mg | ORAL_TABLET | Freq: Every day | ORAL | 4 refills | Status: DC
Start: 2020-04-05 — End: 2021-04-16

## 2020-04-05 NOTE — Patient Instructions (Addendum)
Please call at this number [facility]) to schedule your mammogram. [phone]  Diabetes Mellitus and Nutrition, Adult When you have diabetes (diabetes mellitus), it is very important to have healthy eating habits because your blood sugar (glucose) levels are greatly affected by what you eat and drink. Eating healthy foods in the appropriate amounts, at about the same times every day, can help you:  Control your blood glucose.  Lower your risk of heart disease.  Improve your blood pressure.  Reach or maintain a healthy weight. Every person with diabetes is different, and each person has different needs for a meal plan. Your health care provider may recommend that you work with a diet and nutrition specialist (dietitian) to make a meal plan that is best for you. Your meal plan may vary depending on factors such as:  The calories you need.  The medicines you take.  Your weight.  Your blood glucose, blood pressure, and cholesterol levels.  Your activity level.  Other health conditions you have, such as heart or kidney disease. How do carbohydrates affect me? Carbohydrates, also called carbs, affect your blood glucose level more than any other type of food. Eating carbs naturally raises the amount of glucose in your blood. Carb counting is a method for keeping track of how many carbs you eat. Counting carbs is important to keep your blood glucose at a healthy level, especially if you use insulin or take certain oral diabetes medicines. It is important to know how many carbs you can safely have in each meal. This is different for every person. Your dietitian can help you calculate how many carbs you should have at each meal and for each snack. Foods that contain carbs include:  Bread, cereal, rice, pasta, and crackers.  Potatoes and corn.  Peas, beans, and lentils.  Milk and yogurt.  Fruit and juice.  Desserts, such as cakes, cookies, ice cream, and candy. How  does alcohol affect me? Alcohol can cause a sudden decrease in blood glucose (hypoglycemia), especially if you use insulin or take certain oral diabetes medicines. Hypoglycemia can be a life-threatening condition. Symptoms of hypoglycemia (sleepiness, dizziness, and confusion) are similar to symptoms of having too much alcohol. If your health care provider says that alcohol is safe for you, follow these guidelines:  Limit alcohol intake to no more than 1 drink per day for nonpregnant women and 2 drinks per day for men. One drink equals 12 oz of beer, 5 oz of wine, or 1 oz of hard liquor.  Do not drink on an empty stomach.  Keep yourself hydrated with water, diet soda, or unsweetened iced tea.  Keep in mind that regular soda, juice, and other mixers may contain a lot of sugar and must be counted as carbs. What are tips for following this plan?  Reading food labels  Start by checking the serving size on the "Nutrition Facts" label of packaged foods and drinks. The amount of calories, carbs, fats, and other nutrients listed on the label is based on one serving of the item. Many items contain more than one serving per package.  Check the total grams (g) of carbs in one serving. You can calculate the number of servings of carbs in one serving by dividing the total carbs by 15. For example, if a food has 30 g of total carbs, it would be equal to 2 servings of carbs.  Check the number of grams (g) of saturated and trans fats in one serving. Choose  foods that have low or no amount of these fats.  Check the number of milligrams (mg) of salt (sodium) in one serving. Most people should limit total sodium intake to less than 2,300 mg per day.  Always check the nutrition information of foods labeled as "low-fat" or "nonfat". These foods may be higher in added sugar or refined carbs and should be avoided.  Talk to your dietitian to identify your daily goals for nutrients listed on the  label. Shopping  Avoid buying canned, premade, or processed foods. These foods tend to be high in fat, sodium, and added sugar.  Shop around the outside edge of the grocery store. This includes fresh fruits and vegetables, bulk grains, fresh meats, and fresh dairy. Cooking  Use low-heat cooking methods, such as baking, instead of high-heat cooking methods like deep frying.  Cook using healthy oils, such as olive, canola, or sunflower oil.  Avoid cooking with butter, cream, or high-fat meats. Meal planning  Eat meals and snacks regularly, preferably at the same times every day. Avoid going long periods of time without eating.  Eat foods high in fiber, such as fresh fruits, vegetables, beans, and whole grains. Talk to your dietitian about how many servings of carbs you can eat at each meal.  Eat 4-6 ounces (oz) of lean protein each day, such as lean meat, chicken, fish, eggs, or tofu. One oz of lean protein is equal to: ? 1 oz of meat, chicken, or fish. ? 1 egg. ?  cup of tofu.  Eat some foods each day that contain healthy fats, such as avocado, nuts, seeds, and fish. Lifestyle  Check your blood glucose regularly.  Exercise regularly as told by your health care provider. This may include: ? 150 minutes of moderate-intensity or vigorous-intensity exercise each week. This could be brisk walking, biking, or water aerobics. ? Stretching and doing strength exercises, such as yoga or weightlifting, at least 2 times a week.  Take medicines as told by your health care provider.  Do not use any products that contain nicotine or tobacco, such as cigarettes and e-cigarettes. If you need help quitting, ask your health care provider.  Work with a Social worker or diabetes educator to identify strategies to manage stress and any emotional and social challenges. Questions to ask a health care provider  Do I need to meet with a diabetes educator?  Do I need to meet with a dietitian?  What  number can I call if I have questions?  When are the best times to check my blood glucose? Where to find more information:  American Diabetes Association: diabetes.org  Academy of Nutrition and Dietetics: www.eatright.[employer] of Diabetes and Digestive and Kidney Diseases (NIH): [employer] Summary  A healthy meal plan will help you control your blood glucose and maintain a healthy lifestyle.  Working with a diet and nutrition specialist (dietitian) can help you make a meal plan that is best for you.  Keep in mind that carbohydrates (carbs) and alcohol have immediate effects on your blood glucose levels. It is important to count carbs and to use alcohol carefully. This information is not intended to replace advice given to you by your health care provider. Make sure you discuss any questions you have with your health care provider. Document Revised: 07/02/2017 Document Reviewed: 08/24/2016 Elsevier Patient Education  2020 [employer].

## 2020-04-05 NOTE — Assessment & Plan Note (Signed)
Chronic, ongoing.  A1C 6.6% today.  Remains well-controlled with Metformin 500 MG BID, continue this at this time.  Continue checking BS at least fasting once weekly and document. Will trial Gabapentin 300 MG prior to bed for possible neuropathy discomfort at work.  Return in 3 months.

## 2020-04-05 NOTE — Progress Notes (Signed)
BP 133/84   Pulse 84   Temp 99.6 F (37.6 C) (Oral)   Wt 176 lb (79.8 kg)   LMP  (LMP Unknown)   SpO2 97%   BMI 31.18 kg/m    Subjective:    Patient ID: Julia Wood, female    DOB: 1960-10-10, 59 y.o.   MRN: [id number]  HPI: Julia Wood is a 59 y.o. female  Chief Complaint  Patient presents with  . Diabetes  . Hypertension  . Hyperlipidemia    DIABETES Last A1C 6.8%, initially placed on Metformin in February with A1C 7.2%.  Taking Metformin one tablet twice a day.   Continues to have off and on foot numbness and tingling.  Taking daily B12 at home.  Notices pins and needles when walking at work, is on feet at work 8-9 hours.   Hypoglycemic episodes:no Polydipsia/polyuria: no Visual disturbance: no Chest pain: no Paresthesias: no Glucose Monitoring: yes  Accucheck frequency: rarely  Fasting glucose: <130  Post prandial:  Evening:  Before meals: Taking Insulin?: no  Long acting insulin:  Short acting insulin: Blood Pressure Monitoring: a few times a week Retinal Examination: Not up to Date Foot Exam: Up to Date Diabetic Education: Not Completed, did not attend Pneumovax: Not up to Date Influenza: Up to Date Aspirin: yes   HYPERTENSION / HYPERLIPIDEMIA Continues on HCTZ, Amlodipine, and Crestor. Satisfied with current treatment? yes Duration of hypertension: chronic BP monitoring frequency: a few times a week BP range: on average 120/70 range at home BP medication side effects: no Duration of hyperlipidemia: chronic Cholesterol medication side effects: no Cholesterol supplements: none Medication compliance: good compliance Aspirin: yes Recent stressors: no Recurrent headaches: no Visual changes: no Palpitations: no Dyspnea: no Chest pain: no Lower extremity edema: no Dizzy/lightheaded: no   Relevant past medical, surgical, family and social history reviewed and updated as indicated. Interim medical history since our last visit  reviewed. Allergies and medications reviewed and updated.  Review of Systems  Constitutional: Negative for activity change, appetite change, diaphoresis, fatigue and fever.  Respiratory: Negative for cough, chest tightness and shortness of breath.   Cardiovascular: Negative for chest pain, palpitations and leg swelling.  Gastrointestinal: Negative.   Endocrine: Negative for cold intolerance, heat intolerance, polydipsia, polyphagia and polyuria.  Neurological: Negative.   Psychiatric/Behavioral: Negative.     Per HPI unless specifically indicated above     Objective:    BP 133/84   Pulse 84   Temp 99.6 F (37.6 C) (Oral)   Wt 176 lb (79.8 kg)   LMP  (LMP Unknown)   SpO2 97%   BMI 31.18 kg/m   Wt Readings from Last 3 Encounters:  04/05/20 176 lb (79.8 kg)  12/29/18 174 lb (78.9 kg)  09/29/18 179 lb (81.2 kg)    Physical Exam Vitals and nursing note reviewed.  Constitutional:      General: She is awake. She is not in acute distress.    Appearance: She is well-developed, well-groomed and overweight. She is not ill-appearing.  HENT:     Head: Normocephalic.     Right Ear: Hearing normal.     Left Ear: Hearing normal.  Eyes:     General: Lids are normal.        Right eye: No discharge.        Left eye: No discharge.     Conjunctiva/sclera: Conjunctivae normal.     Pupils: Pupils are equal, round, and reactive to light.  Neck:  Thyroid: No thyromegaly.     Vascular: No carotid bruit.  Cardiovascular:     Rate and Rhythm: Normal rate and regular rhythm.     Pulses:          Dorsalis pedis pulses are 2+ on the right side and 2+ on the left side.       Posterior tibial pulses are 2+ on the right side and 2+ on the left side.     Heart sounds: Normal heart sounds. No murmur heard.  No gallop.   Pulmonary:     Effort: Pulmonary effort is normal. No accessory muscle usage or respiratory distress.     Breath sounds: Normal breath sounds.  Abdominal:     General:  Bowel sounds are normal.     Palpations: Abdomen is soft.  Musculoskeletal:     Cervical back: Normal range of motion and neck supple.     Right lower leg: No edema.     Left lower leg: No edema.  Feet:     Right foot:     Protective Sensation: 10 sites tested. 10 sites sensed.     Skin integrity: No ulcer.     Toenail Condition: Right toenails are normal.     Left foot:     Protective Sensation: 10 sites tested. 10 sites sensed.     Skin integrity: No ulcer.     Toenail Condition: Left toenails are normal.  Skin:    General: Skin is warm and dry.  Neurological:     Mental Status: She is alert and oriented to person, place, and time.  Psychiatric:        Attention and Perception: Attention normal.        Mood and Affect: Mood normal.        Behavior: Behavior normal. Behavior is cooperative.        Thought Content: Thought content normal.        Judgment: Judgment normal.        Results for orders placed or performed in visit on 01/10/20  Bayer DCA Hb A1c Waived  Result Value Ref Range   HB A1C (BAYER DCA - WAIVED) 6.8 <7.0 %      Assessment & Plan:   Problem List Items Addressed This Visit      Cardiovascular and Mediastinum   Hypertension associated with diabetes ([place])    Chronic, ongoing with BP at goal.  Urine ALB 30 and A:C <30 last visit.  Continue current regimen and adjust as needed, no current ACR or ARB may benefit from this in future for kidney protection discussed with patient and next visit will consider discontinuing Amlodipine and starting ACE/ARB.  BMP today. Return in 3 months.        Relevant Medications   rosuvastatin (CRESTOR) 10 MG tablet   hydrochlorothiazide (HYDRODIURIL) 25 MG tablet   amLODipine (NORVASC) 5 MG tablet   metFORMIN (GLUCOPHAGE) 500 MG tablet   Other Relevant Orders   Bayer DCA Hb A1c Waived   Basic metabolic panel   TSH     Endocrine   Hyperlipidemia associated with type 2 diabetes mellitus (HCC)    Chronic, ongoing.   Continue current medication regimen and adjust as needed.  LDL 42 and TCHOL 119 recent visit, at goal.  Lipid panel today.      Relevant Medications   rosuvastatin (CRESTOR) 10 MG tablet   metFORMIN (GLUCOPHAGE) 500 MG tablet   Other Relevant Orders   Bayer DCA Hb A1c Waived  Lipid Panel w/o Chol/HDL Ratio   Type 2 diabetes mellitus with proteinuria (HCC) - Primary    Chronic, ongoing.  A1C 6.6% today.  Remains well-controlled with Metformin 500 MG BID, continue this at this time.  Continue checking BS at least fasting once weekly and document. Will trial Gabapentin 300 MG prior to bed for possible neuropathy discomfort at work.  Return in 3 months.      Relevant Medications   rosuvastatin (CRESTOR) 10 MG tablet   metFORMIN (GLUCOPHAGE) 500 MG tablet   Other Relevant Orders   Bayer DCA Hb A1c Waived     Other   Obesity (BMI 30-39.9)    Recommended eating smaller high protein, low fat meals more frequently and exercising 30 mins a day 5 times a week with a goal of 10-15lb weight loss in the next 3 months. Patient voiced their understanding and motivation to adhere to these recommendations.       Relevant Medications   metFORMIN (GLUCOPHAGE) 500 MG tablet    Other Visit Diagnoses    Colon cancer screening       GI referral in   Relevant Orders   Ambulatory referral to Gastroenterology   Encounter for screening mammogram for malignant neoplasm of breast       Mammogram order in   Relevant Orders   MM DIGITAL SCREENING BILATERAL      Follow up plan: Return in about 3 months (around 07/05/2020) for T2DM, HTN/HLD.

## 2020-04-05 NOTE — Assessment & Plan Note (Signed)
Recommended eating smaller high protein, low fat meals more frequently and exercising 30 mins a day 5 times a week with a goal of 10-15lb weight loss in the next 3 months. Patient voiced their understanding and motivation to adhere to these recommendations.  

## 2020-04-05 NOTE — Assessment & Plan Note (Addendum)
Chronic, ongoing with BP at goal.  Urine ALB 30 and A:C <30 last visit.  Continue current regimen and adjust as needed, no current ACR or ARB may benefit from this in future for kidney protection discussed with patient and next visit will consider discontinuing Amlodipine and starting ACE/ARB.  BMP today. Return in 3 months.

## 2020-04-05 NOTE — Assessment & Plan Note (Signed)
Chronic, ongoing.  Continue current medication regimen and adjust as needed.  LDL 42 and TCHOL 119 recent visit, at goal.  Lipid panel today.

## 2020-04-06 LAB — LIPID PANEL W/O CHOL/HDL RATIO
Cholesterol, Total: 108 mg/dL (ref 100–199)
HDL: 30 mg/dL — ABNORMAL LOW (ref >39)
LDL Chol Calc (NIH): 58 mg/dL (ref 0–99)
Triglycerides: 105 mg/dL (ref 0–149)
VLDL Cholesterol Cal: 20 mg/dL (ref 5–40)

## 2020-04-06 LAB — BASIC METABOLIC PANEL
BUN/Creatinine Ratio: 12 (ref 12–28)
BUN: 10 mg/dL (ref 8–27)
CO2: 29 mmol/L (ref 20–29)
Calcium: 9.6 mg/dL (ref 8.7–10.3)
Chloride: 97 mmol/L (ref 96–106)
Creatinine, Ser: 0.85 mg/dL (ref 0.57–1.00)
GFR calc Af Amer: 86 mL/min/{1.73_m2} (ref >59)
GFR calc non Af Amer: 75 mL/min/{1.73_m2} (ref >59)
Glucose: 97 mg/dL (ref 65–99)
Potassium: 3.5 mmol/L (ref 3.5–5.2)
Sodium: 138 mmol/L (ref 134–144)

## 2020-04-06 LAB — TSH: TSH: 1.05 u[IU]/mL (ref 0.450–4.500)

## 2020-04-08 NOTE — Progress Notes (Signed)
Please let Neysa know that labs have returned and kidney function remains stable, cholesterol levels are within goal range, and thyroid in normal range.  Continue all current medications.  Any questions?  Have a fantastic day!!

## 2020-04-16 ENCOUNTER — Encounter: Payer: Self-pay | Admitting: *Deleted

## 2020-05-16 ENCOUNTER — Telehealth: Payer: 59

## 2020-05-16 ENCOUNTER — Encounter: Payer: Self-pay | Admitting: *Deleted

## 2020-06-14 ENCOUNTER — Telehealth: Payer: Self-pay

## 2020-06-14 NOTE — Telephone Encounter (Signed)
Pt needs a letter for work with restrictions due to her foot pain. Please advise.

## 2020-06-14 NOTE — Telephone Encounter (Signed)
Called patient and informed her that since there is no mention of said foot pain in her past few notes that she would need to make an appointment to receive this letter from the provider

## 2020-07-09 ENCOUNTER — Encounter: Payer: Self-pay | Admitting: Nurse Practitioner

## 2020-07-09 ENCOUNTER — Other Ambulatory Visit: Payer: Self-pay

## 2020-07-09 ENCOUNTER — Ambulatory Visit: Payer: 59 | Admitting: Nurse Practitioner

## 2020-07-09 VITALS — BP 128/80 | HR 73 | Temp 98.5°F | Ht 64.88 in | Wt 180.4 lb

## 2020-07-09 DIAGNOSIS — E1142 Type 2 diabetes mellitus with diabetic polyneuropathy: Secondary | ICD-10-CM | POA: Insufficient documentation

## 2020-07-09 DIAGNOSIS — E1159 Type 2 diabetes mellitus with other circulatory complications: Secondary | ICD-10-CM | POA: Diagnosis not present

## 2020-07-09 DIAGNOSIS — E1169 Type 2 diabetes mellitus with other specified complication: Secondary | ICD-10-CM | POA: Diagnosis not present

## 2020-07-09 DIAGNOSIS — E1129 Type 2 diabetes mellitus with other diabetic kidney complication: Secondary | ICD-10-CM | POA: Diagnosis not present

## 2020-07-09 DIAGNOSIS — E785 Hyperlipidemia, unspecified: Secondary | ICD-10-CM

## 2020-07-09 DIAGNOSIS — Z1211 Encounter for screening for malignant neoplasm of colon: Secondary | ICD-10-CM

## 2020-07-09 DIAGNOSIS — I152 Hypertension secondary to endocrine disorders: Secondary | ICD-10-CM

## 2020-07-09 DIAGNOSIS — R809 Proteinuria, unspecified: Secondary | ICD-10-CM

## 2020-07-09 DIAGNOSIS — E669 Obesity, unspecified: Secondary | ICD-10-CM

## 2020-07-09 LAB — BAYER DCA HB A1C WAIVED: HB A1C (BAYER DCA - WAIVED): 7 % — ABNORMAL HIGH (ref <7.0)

## 2020-07-09 MED ORDER — GABAPENTIN 300 MG PO CAPS: 600.0000 mg | ORAL_CAPSULE | Freq: Every day | ORAL | 4 refills | Status: DC

## 2020-07-09 NOTE — Assessment & Plan Note (Signed)
Chronic, ongoing.  A1C 7% today, slight trend upwards.  Remains well-controlled with Metformin 500 MG BID, continue this at this time.  Continue checking BS at least fasting once weekly and document. Will increase Gabapentin to 600 MG prior to bed for possible neuropathy discomfort at work.  Return in 3 months.

## 2020-07-09 NOTE — Assessment & Plan Note (Addendum)
Chronic, ongoing.  A1C 7% today, slight trend upwards.  Remains well-controlled with Metformin 500 MG BID, continue this at this time.  Continue checking BS at least fasting once weekly and document. Urine micro next visit and will consider discontinuing Amlodipine and starting ACE or ARB -- discussed with patient.  Return in 3 months.

## 2020-07-09 NOTE — Assessment & Plan Note (Signed)
Chronic, ongoing.  Continue current medication regimen and adjust as needed.  LDL 58 and TCHOL 108 recent visit, at goal.  Lipid panel next visit.

## 2020-07-09 NOTE — Assessment & Plan Note (Signed)
Chronic, ongoing with BP at goal on recheck today.  Urine ALB 30 and A:C <30 last visit.  Continue current regimen and adjust as needed, no current ACR or ARB may benefit from this in future for kidney protection discussed with patient and next visit will consider discontinuing Amlodipine and starting ACE/ARB.  BMP next visit. Return in 3 months.

## 2020-07-09 NOTE — Progress Notes (Signed)
BP 128/80 (BP Location: Left Arm)   Pulse 73   Temp 98.5 F (36.9 C) (Oral)   Ht 5' 4.88" (1.648 m)   Wt 180 lb 6.4 oz (81.8 kg)   LMP  (LMP Unknown)   SpO2 97%   BMI 30.13 kg/m    Subjective:    Patient ID: [name], female    DOB: 1961-07-17, 59 y.o.   MRN: [id number]  HPI: MAYLINE DRAGON is a 59 y.o. female  Chief Complaint  Patient presents with  . Diabetes  . Foot Pain    R > L    DIABETES Last A1C 6.6% September, initially placed on Metformin in February 2020 with A1C 7.2%.  Taking Metformin one tablet twice a day.  Hypoglycemic episodes:no Polydipsia/polyuria: no Visual disturbance: no Chest pain: no Paresthesias: no Glucose Monitoring: yes  Accucheck frequency: rarely  Fasting glucose: <130 -- 84 two weeks ago  Post prandial:  Evening:  Before meals: Taking Insulin?: no  Long acting insulin:  Short acting insulin: Blood Pressure Monitoring: a few times a week Retinal Examination: Not up to Date Foot Exam: Up to Date Pneumovax: Not up to Date Influenza: Not Up to Date Aspirin: yes   HYPERTENSION / HYPERLIPIDEMIA Continues on HCTZ, Amlodipine, and Crestor. Satisfied with current treatment? yes Duration of hypertension: chronic BP monitoring frequency: a few times a week BP range: on average 130/70 range BP medication side effects: no Duration of hyperlipidemia: chronic Cholesterol medication side effects: no Cholesterol supplements: none Medication compliance: good compliance Aspirin: yes Recent stressors: no Recurrent headaches: no Visual changes: no Palpitations: no Dyspnea: no Chest pain: no Lower extremity edema: no Dizzy/lightheaded: no   FOOT PAIN Continues to have off and on foot numbness and tingling R>L.  Taking daily B12 at home, no recent level.  Notices pins and needles when walking at work, is on feet at work 8-9 hours.  Started Gabapentin last visit -- but this made her tired at work.  This helps with discomfort.   Notices pain more at night, after working.   Duration: months Involved foot: bilateral Mechanism of injury: unknown Location: bottom of feet Onset: gradual  Severity: moderate  Quality:  aching and burning Frequency: intermittent Radiation: no Aggravating factors: walking and running  Alleviating factors: Gabapentin and arch support in shoes Status: better Treatments attempted: Gabapentin, physical therapy and HEP  Relief with NSAIDs?:  No NSAIDs Taken Weakness with weight bearing or walking: no Morning stiffness: no Swelling: no Redness: no Bruising: no Paresthesias / decreased sensation: no  Fevers:no  Relevant past medical, surgical, family and social history reviewed and updated as indicated. Interim medical history since our last visit reviewed. Allergies and medications reviewed and updated.  Review of Systems  Constitutional: Negative for activity change, appetite change, diaphoresis, fatigue and fever.  Respiratory: Negative for cough, chest tightness and shortness of breath.   Cardiovascular: Negative for chest pain, palpitations and leg swelling.  Gastrointestinal: Negative.   Endocrine: Negative for cold intolerance, heat intolerance, polydipsia, polyphagia and polyuria.  Neurological: Negative.   Psychiatric/Behavioral: Negative.     Per HPI unless specifically indicated above     Objective:    BP 128/80 (BP Location: Left Arm)   Pulse 73   Temp 98.5 F (36.9 C) (Oral)   Ht 5' 4.88" (1.648 m)   Wt 180 lb 6.4 oz (81.8 kg)   LMP  (LMP Unknown)   SpO2 97%   BMI 30.13 kg/m   Wt Readings from  Last 3 Encounters:  07/09/20 180 lb 6.4 oz (81.8 kg)  04/05/20 176 lb (79.8 kg)  12/29/18 174 lb (78.9 kg)    Physical Exam Vitals and nursing note reviewed.  Constitutional:      General: She is awake. She is not in acute distress.    Appearance: She is well-developed, well-groomed and overweight. She is not ill-appearing.  HENT:     Head: Normocephalic.      Right Ear: Hearing normal.     Left Ear: Hearing normal.  Eyes:     General: Lids are normal.        Right eye: No discharge.        Left eye: No discharge.     Conjunctiva/sclera: Conjunctivae normal.     Pupils: Pupils are equal, round, and reactive to light.  Neck:     Thyroid: No thyromegaly.     Vascular: No carotid bruit.  Cardiovascular:     Rate and Rhythm: Normal rate and regular rhythm.     Pulses:          Dorsalis pedis pulses are 2+ on the right side and 2+ on the left side.       Posterior tibial pulses are 2+ on the right side and 2+ on the left side.     Heart sounds: Normal heart sounds. No murmur heard.  No gallop.   Pulmonary:     Effort: Pulmonary effort is normal. No accessory muscle usage or respiratory distress.     Breath sounds: Normal breath sounds.  Abdominal:     General: Bowel sounds are normal.     Palpations: Abdomen is soft.  Musculoskeletal:     Cervical back: Normal range of motion and neck supple.     Right lower leg: No edema.     Left lower leg: No edema.  Feet:     Right foot:     Protective Sensation: 10 sites tested. 10 sites sensed.     Skin integrity: No ulcer.     Toenail Condition: Right toenails are normal.     Left foot:     Protective Sensation: 10 sites tested. 10 sites sensed.     Skin integrity: No ulcer.     Toenail Condition: Left toenails are normal.  Skin:    General: Skin is warm and dry.  Neurological:     Mental Status: She is alert and oriented to person, place, and time.  Psychiatric:        Attention and Perception: Attention normal.        Mood and Affect: Mood normal.        Behavior: Behavior normal. Behavior is cooperative.        Thought Content: Thought content normal.        Judgment: Judgment normal.     Results for orders placed or performed in visit on 04/05/20  Bayer DCA Hb A1c Waived  Result Value Ref Range   HB A1C (BAYER DCA - WAIVED) 6.6 <1.9 %  Basic metabolic panel  Result Value Ref  Range   Glucose 97 65 - 99 mg/dL   BUN 10 8 - 27 mg/dL   Creatinine, Ser 0.85 0.57 - 1.00 mg/dL   GFR calc non Af Amer 75 >59 mL/min/1.73   GFR calc Af Amer 86 >59 mL/min/1.73   BUN/Creatinine Ratio 12 12 - 28   Sodium 138 134 - 144 mmol/L   Potassium 3.5 3.5 - 5.2 mmol/L   Chloride 97 96 -  106 mmol/L   CO2 29 20 - 29 mmol/L   Calcium 9.6 8.7 - 10.3 mg/dL  Lipid Panel w/o Chol/HDL Ratio  Result Value Ref Range   Cholesterol, Total 108 100 - 199 mg/dL   Triglycerides 105 0 - 149 mg/dL   HDL 30 (L) >39 mg/dL   VLDL Cholesterol Cal 20 5 - 40 mg/dL   LDL Chol Calc (NIH) 58 0 - 99 mg/dL  TSH  Result Value Ref Range   TSH 1.050 0.450 - 4.500 uIU/mL      Assessment & Plan:   Problem List Items Addressed This Visit      Cardiovascular and Mediastinum   Hypertension associated with diabetes ([facility])    Chronic, ongoing with BP at goal on recheck today.  Urine ALB 30 and A:C <30 last visit.  Continue current regimen and adjust as needed, no current ACR or ARB may benefit from this in future for kidney protection discussed with patient and next visit will consider discontinuing Amlodipine and starting ACE/ARB.  BMP next visit. Return in 3 months.          Endocrine   Hyperlipidemia associated with type 2 diabetes mellitus (HCC)    Chronic, ongoing.  Continue current medication regimen and adjust as needed.  LDL 58 and TCHOL 108 recent visit, at goal.  Lipid panel next visit.      Type 2 diabetes mellitus with proteinuria (HCC) - Primary    Chronic, ongoing.  A1C 7% today, slight trend upwards.  Remains well-controlled with Metformin 500 MG BID, continue this at this time.  Continue checking BS at least fasting once weekly and document. Urine micro next visit and will consider discontinuing Amlodipine and starting ACE or ARB -- discussed with patient.  Return in 3 months.      Relevant Orders   Bayer DCA Hb A1c Waived   Type 2 diabetes mellitus with peripheral neuropathy (HCC)     Chronic, ongoing.  A1C 7% today, slight trend upwards.  Remains well-controlled with Metformin 500 MG BID, continue this at this time.  Continue checking BS at least fasting once weekly and document. Will increase Gabapentin to 600 MG prior to bed for possible neuropathy discomfort at work.  Return in 3 months.      Relevant Medications   gabapentin (NEURONTIN) 300 MG capsule     Other   Obesity (BMI 30-39.9)    Recommended eating smaller high protein, low fat meals more frequently and exercising 30 mins a day 5 times a week with a goal of 10-15lb weight loss in the next 3 months. Patient voiced their understanding and motivation to adhere to these recommendations.        Other Visit Diagnoses    Colon cancer screening       GI referral placed.   Relevant Orders   Ambulatory referral to Gastroenterology      Follow up plan: Return in about 3 months (around 10/07/2020) for T2DM, HTN/HLD.

## 2020-07-09 NOTE — Assessment & Plan Note (Signed)
Recommended eating smaller high protein, low fat meals more frequently and exercising 30 mins a day 5 times a week with a goal of 10-15lb weight loss in the next 3 months. Patient voiced their understanding and motivation to adhere to these recommendations.  

## 2020-07-09 NOTE — Patient Instructions (Signed)
[facility]  Address: [address] [place], [place], [place] [address]  Phone: [phone]   Diabetes Mellitus and Nutrition, Adult When you have diabetes (diabetes mellitus), it is very important to have healthy eating habits because your blood sugar (glucose) levels are greatly affected by what you eat and drink. Eating healthy foods in the appropriate amounts, at about the same times every day, can help you:  Control your blood glucose.  Lower your risk of heart disease.  Improve your blood pressure.  Reach or maintain a healthy weight. Every person with diabetes is different, and each person has different needs for a meal plan. Your health care provider may recommend that you work with a diet and nutrition specialist (dietitian) to make a meal plan that is best for you. Your meal plan may vary depending on factors such as:  The calories you need.  The medicines you take.  Your weight.  Your blood glucose, blood pressure, and cholesterol levels.  Your activity level.  Other health conditions you have, such as heart or kidney disease. How do carbohydrates affect me? Carbohydrates, also called carbs, affect your blood glucose level more than any other type of food. Eating carbs naturally raises the amount of glucose in your blood. Carb counting is a method for keeping track of how many carbs you eat. Counting carbs is important to keep your blood glucose at a healthy level, especially if you use insulin or take certain oral diabetes medicines. It is important to know how many carbs you can safely have in each meal. This is different for every person. Your dietitian can help you calculate how many carbs you should have at each meal and for each snack. Foods that contain carbs include:  Bread, cereal, rice, pasta, and crackers.  Potatoes and corn.  Peas, beans, and lentils.  Milk and yogurt.  Fruit and juice.  Desserts, such as cakes,  cookies, ice cream, and candy. How does alcohol affect me? Alcohol can cause a sudden decrease in blood glucose (hypoglycemia), especially if you use insulin or take certain oral diabetes medicines. Hypoglycemia can be a life-threatening condition. Symptoms of hypoglycemia (sleepiness, dizziness, and confusion) are similar to symptoms of having too much alcohol. If your health care provider says that alcohol is safe for you, follow these guidelines:  Limit alcohol intake to no more than 1 drink per day for nonpregnant women and 2 drinks per day for men. One drink equals 12 oz of beer, 5 oz of wine, or 1 oz of hard liquor.  Do not drink on an empty stomach.  Keep yourself hydrated with water, diet soda, or unsweetened iced tea.  Keep in mind that regular soda, juice, and other mixers may contain a lot of sugar and must be counted as carbs. What are tips for following this plan?  Reading food labels  Start by checking the serving size on the "Nutrition Facts" label of packaged foods and drinks. The amount of calories, carbs, fats, and other nutrients listed on the label is based on one serving of the item. Many items contain more than one serving per package.  Check the total grams (g) of carbs in one serving. You can calculate the number of servings of carbs in one serving by dividing the total carbs by 15. For example, if a food has 30 g of total carbs, it would be equal to 2 servings of carbs.  Check the number of grams (g) of saturated  and trans fats in one serving. Choose foods that have low or no amount of these fats.  Check the number of milligrams (mg) of salt (sodium) in one serving. Most people should limit total sodium intake to less than 2,300 mg per day.  Always check the nutrition information of foods labeled as "low-fat" or "nonfat". These foods may be higher in added sugar or refined carbs and should be avoided.  Talk to your dietitian to identify your daily goals for  nutrients listed on the label. Shopping  Avoid buying canned, premade, or processed foods. These foods tend to be high in fat, sodium, and added sugar.  Shop around the outside edge of the grocery store. This includes fresh fruits and vegetables, bulk grains, fresh meats, and fresh dairy. Cooking  Use low-heat cooking methods, such as baking, instead of high-heat cooking methods like deep frying.  Cook using healthy oils, such as olive, canola, or sunflower oil.  Avoid cooking with butter, cream, or high-fat meats. Meal planning  Eat meals and snacks regularly, preferably at the same times every day. Avoid going long periods of time without eating.  Eat foods high in fiber, such as fresh fruits, vegetables, beans, and whole grains. Talk to your dietitian about how many servings of carbs you can eat at each meal.  Eat 4-6 ounces (oz) of lean protein each day, such as lean meat, chicken, fish, eggs, or tofu. One oz of lean protein is equal to: ? 1 oz of meat, chicken, or fish. ? 1 egg. ?  cup of tofu.  Eat some foods each day that contain healthy fats, such as avocado, nuts, seeds, and fish. Lifestyle  Check your blood glucose regularly.  Exercise regularly as told by your health care provider. This may include: ? 150 minutes of moderate-intensity or vigorous-intensity exercise each week. This could be brisk walking, biking, or water aerobics. ? Stretching and doing strength exercises, such as yoga or weightlifting, at least 2 times a week.  Take medicines as told by your health care provider.  Do not use any products that contain nicotine or tobacco, such as cigarettes and e-cigarettes. If you need help quitting, ask your health care provider.  Work with a Social worker or diabetes educator to identify strategies to manage stress and any emotional and social challenges. Questions to ask a health care provider  Do I need to meet with a diabetes educator?  Do I need to meet with a  dietitian?  What number can I call if I have questions?  When are the best times to check my blood glucose? Where to find more information:  American Diabetes Association: diabetes.org  Academy of Nutrition and Dietetics: www.eatright.[employer] of Diabetes and Digestive and Kidney Diseases (NIH): [employer] Summary  A healthy meal plan will help you control your blood glucose and maintain a healthy lifestyle.  Working with a diet and nutrition specialist (dietitian) can help you make a meal plan that is best for you.  Keep in mind that carbohydrates (carbs) and alcohol have immediate effects on your blood glucose levels. It is important to count carbs and to use alcohol carefully. This information is not intended to replace advice given to you by your health care provider. Make sure you discuss any questions you have with your health care provider. Document Revised: 07/02/2017 Document Reviewed: 08/24/2016 Elsevier Patient Education  2020 [employer].

## 2020-07-16 ENCOUNTER — Telehealth: Payer: Self-pay | Admitting: General Practice

## 2020-07-16 ENCOUNTER — Ambulatory Visit: Payer: Self-pay | Admitting: General Practice

## 2020-07-16 DIAGNOSIS — I152 Hypertension secondary to endocrine disorders: Secondary | ICD-10-CM

## 2020-07-16 DIAGNOSIS — E1129 Type 2 diabetes mellitus with other diabetic kidney complication: Secondary | ICD-10-CM

## 2020-07-16 DIAGNOSIS — E785 Hyperlipidemia, unspecified: Secondary | ICD-10-CM

## 2020-07-16 NOTE — Chronic Care Management (AMB) (Signed)
Care Management   Follow Up Note   07/16/2020 Name: [name] MRN: [id number] DOB: 31-Jul-1961  [name] is enrolled in a Managed Medicaid plan: No. Outreach attempt today was successful.    Referred by: Venita Lick, NP Reason for referral : Care Coordination (RNCM Chronic Disease Management and Care Coordination Needs)   Julia Wood is a 59 y.o. year old female who is a primary care patient of Cannady, Barbaraann Faster, NP. The care management team was consulted for assistance with care management and care coordination needs.    Review of patient status, including review of consultants reports, relevant laboratory and other test results, and collaboration with appropriate care team members and the patient's provider was performed as part of comprehensive patient evaluation and provision of chronic care management services.    BP Readings from Last 3 Encounters:  07/09/20 128/80  04/05/20 133/84  09/22/19 137/71   Lab Results  Component Value Date   HGBA1C 7.0 (H) 07/09/2020    Lab Results  Component Value Date   CHOL 108 04/05/2020   HDL 30 (L) 04/05/2020   LDLCALC 58 04/05/2020   TRIG 105 04/05/2020   Goals Addressed            This Visit's Progress   . RNCM: Pt-"I don't have an issue with low blood sugars"       CARE PLAN ENTRY (see longtitudinal plan of care for additional care plan information)  Current Barriers:  . Chronic Disease Management support, education, and care coordination needs related to HTN, HLD, and DMII  Clinical Goal(s) related to HTN, HLD, and DMII:  Over the next 120 days, patient will:  . Work with the care management team to address educational, disease management, and care coordination needs  . Begin or continue self health monitoring activities as directed today Measure and record cbg (blood glucose) 1 times daily, Measure and record blood pressure 5 times per week, and adhere to a heart healthy/ADA diet . Call  provider office for new or worsened signs and symptoms Blood glucose findings outside established parameters, Blood pressure findings outside established parameters, and New or worsened symptom related to HLD and other chronic conditions . Call care management team with questions or concerns . Verbalize basic understanding of patient centered plan of care established today  Interventions related to HTN, HLD, and DMII:  . Evaluation of current treatment plans and patient's adherence to plan as established by provider.  The patient endorses compliance with plan of care.  07-16-2020: The patient is doing well. Denies any acute distress. Saw pcp on 07-09-2020 and got a good report.  . Assessed patient understanding of disease states.  The patient has a good understand of her chronic conditions. The patients most recent hemoglobin A1C was 6.5.  The patient checks her blood sugars regularly. 07-16-2020: The patient states that her blood sugars have been in 130's. The patient denies any issues with her diabetes. Newest hemoglobin A1C was 7.0% on 07-09-2020.   . Assessed patient's education and care coordination needs.  Denies any needs at this time.  07-16-2020: Education on goal of hemoglobin A1C being 7.0 or less. The patient is at high end of goal. Education on watching sugars and starches as well as sodium and fats. The patient states she is trying to be mindful of her dietary intake. The patient states that she knows it is easy to get off track with the holidays. The patient states she does walk several  steps in a night. She walks patrol.  She does not keep track of her steps each night but states she walks a lot. Education on apps she can download for free to keep track of her steps. Education on regular exercise helping with maintaining good glycemic control.  . Provided disease specific education to patient.  07-16-2020: The patient endorses a heart healthy/ADA diet. States her appetite is good and she is  staying hydrated.  Working and getting adequate rest.  . Collaborated with appropriate clinical care team members regarding patient needs.  Knows about LCSW and pharm D. No expressed needs at this time.  . Evaluation of upcoming appointments. Sees pcp 10-09-2020 at 0920 am  Patient Self Care Activities related to HTN, HLD, and DMII:  . Patient is unable to independently self-manage chronic health conditions  Please see past updates related to this goal by clicking on the "Past Updates" button in the selected goal          Telephone follow up appointment with care management team member scheduled for: 09-17-2020 at 3:45 pm  Noreene Larsson RN, MSN, [facility] Mobile: [phone]

## 2020-07-16 NOTE — Patient Instructions (Signed)
Visit Information  Goals Addressed            This Visit's Progress    RNCM: Pt-"I don't have an issue with low blood sugars"       CARE PLAN ENTRY (see longtitudinal plan of care for additional care plan information)  Current Barriers:   Chronic Disease Management support, education, and care coordination needs related to HTN, HLD, and DMII  Clinical Goal(s) related to HTN, HLD, and DMII:  Over the next 120 days, patient will:   Work with the care management team to address educational, disease management, and care coordination needs   Begin or continue self health monitoring activities as directed today Measure and record cbg (blood glucose) 1 times daily, Measure and record blood pressure 5 times per week, and adhere to a heart healthy/ADA diet  Call provider office for new or worsened signs and symptoms Blood glucose findings outside established parameters, Blood pressure findings outside established parameters, and New or worsened symptom related to HLD and other chronic conditions  Call care management team with questions or concerns  Verbalize basic understanding of patient centered plan of care established today  Interventions related to HTN, HLD, and DMII:   Evaluation of current treatment plans and patient's adherence to plan as established by provider.  The patient endorses compliance with plan of care.  07-16-2020: The patient is doing well. Denies any acute distress. Saw pcp on 07-09-2020 and got a good report.   Assessed patient understanding of disease states.  The patient has a good understand of her chronic conditions. The patients most recent hemoglobin A1C was 6.5.  The patient checks her blood sugars regularly. 07-16-2020: The patient states that her blood sugars have been in 130's. The patient denies any issues with her diabetes. Newest hemoglobin A1C was 7.0% on 07-09-2020.    Assessed patient's education and care coordination needs.  Denies any needs at this  time.  07-16-2020: Education on goal of hemoglobin A1C being 7.0 or less. The patient is at high end of goal. Education on watching sugars and starches as well as sodium and fats. The patient states she is trying to be mindful of her dietary intake. The patient states that she knows it is easy to get off track with the holidays. The patient states she does walk several steps in a night. She walks patrol.  She does not keep track of her steps each night but states she walks a lot. Education on apps she can download for free to keep track of her steps. Education on regular exercise helping with maintaining good glycemic control.   Provided disease specific education to patient.  07-16-2020: The patient endorses a heart healthy/ADA diet. States her appetite is good and she is staying hydrated.  Working and getting adequate rest.   Collaborated with appropriate clinical care team members regarding patient needs.  Knows about LCSW and pharm D. No expressed needs at this time.   Evaluation of upcoming appointments. Sees pcp 10-09-2020 at 0920 am  Patient Self Care Activities related to HTN, HLD, and DMII:   Patient is unable to independently self-manage chronic health conditions  Please see past updates related to this goal by clicking on the "Past Updates" button in the selected goal         The patient verbalized understanding of instructions, educational materials, and care plan provided today and declined offer to receive copy of patient instructions, educational materials, and care plan.   Telephone follow up appointment  with care management team member scheduled for: 09-17-2020 at 3:45 pm  [place], MSN, [facility] Mobile: [phone]

## 2020-07-18 ENCOUNTER — Telehealth (INDEPENDENT_AMBULATORY_CARE_PROVIDER_SITE_OTHER): Payer: Self-pay | Admitting: Gastroenterology

## 2020-07-18 ENCOUNTER — Other Ambulatory Visit: Payer: Self-pay

## 2020-07-18 DIAGNOSIS — Z86010 Personal history of colonic polyps: Secondary | ICD-10-CM

## 2020-07-18 MED ORDER — GOLYTELY 236 G PO SOLR: 4000.0000 mL | Freq: Once | ORAL | 0 refills | Status: AC

## 2020-07-18 NOTE — Progress Notes (Signed)
Gastroenterology Pre-Procedure Review  Request Date: Thursday 08/22/20 Requesting Physician: Dr. Bonna Gains  PATIENT REVIEW QUESTIONS: The patient responded to the following health history questions as indicated:    1. Are you having any GI issues? no 2. Do you have a personal history of Polyps? yes (09/29/18 colon polyps noted by Dr. Bonna Gains)  3. Do you have a family history of Colon Cancer or Polyps? no 4. Diabetes Mellitus? yes (type 2) 5. Joint replacements in the past 12 months?no 6. Major health problems in the past 3 months?no 7. Any artificial heart valves, MVP, or defibrillator?no    MEDICATIONS & ALLERGIES:    Patient reports the following regarding taking any anticoagulation/antiplatelet therapy:   Plavix, Coumadin, Eliquis, Xarelto, Lovenox, Pradaxa, Brilinta, or Effient? no Aspirin? no  Patient confirms/reports the following medications:  Current Outpatient Medications  Medication Sig Dispense Refill  . amLODipine (NORVASC) 5 MG tablet Take 1 tablet (5 mg total) by mouth daily. 90 tablet 4  . aspirin EC 81 MG tablet Take 81 mg by mouth daily.    . blood glucose meter kit and supplies KIT Dispense based on patient and insurance preference. Use up to four times daily as directed. (FOR ICD-9 250.00, 250.01). 1 each 0  . gabapentin (NEURONTIN) 300 MG capsule Take 2 capsules (600 mg total) by mouth at bedtime. Take this around 3 am when you go to bed. 180 capsule 4  . hydrochlorothiazide (HYDRODIURIL) 25 MG tablet Take 1 tablet (25 mg total) by mouth daily. 90 tablet 4  . metFORMIN (GLUCOPHAGE) 500 MG tablet Take 1 tablet (500 mg total) by mouth 2 (two) times daily with a meal. 180 tablet 4  . rosuvastatin (CRESTOR) 10 MG tablet Take 1 tablet (10 mg total) by mouth daily. 90 tablet 4   No current facility-administered medications for this visit.    Patient confirms/reports the following allergies:  No Known Allergies  No orders of the defined types were placed in this  encounter.   AUTHORIZATION INFORMATION Primary Insurance: 1D#: Group #:  Secondary Insurance: 1D#: Group #:  SCHEDULE INFORMATION: Date: Thursday 08/22/20 Time: Location:[facility]

## 2020-08-20 ENCOUNTER — Other Ambulatory Visit: Payer: Self-pay

## 2020-08-20 ENCOUNTER — Other Ambulatory Visit
Admission: RE | Admit: 2020-08-20 | Discharge: 2020-08-20 | Disposition: A | Discharge status: Home / Self Care | Payer: 59 | Source: Ambulatory Visit | Attending: Gastroenterology | Admitting: Gastroenterology

## 2020-08-20 DIAGNOSIS — Z01812 Encounter for preprocedural laboratory examination: Secondary | ICD-10-CM | POA: Insufficient documentation

## 2020-08-20 DIAGNOSIS — Z20822 Contact with and (suspected) exposure to covid-19: Secondary | ICD-10-CM | POA: Insufficient documentation

## 2020-08-20 LAB — SARS CORONAVIRUS 2 (TAT 6-24 HRS): SARS Coronavirus 2: NEGATIVE

## 2020-08-21 ENCOUNTER — Encounter: Payer: Self-pay | Admitting: Gastroenterology

## 2020-08-22 ENCOUNTER — Encounter: Payer: Self-pay | Admitting: Gastroenterology

## 2020-08-22 ENCOUNTER — Ambulatory Visit
Admission: RE | Admit: 2020-08-22 | Discharge: 2020-08-22 | Disposition: A | Discharge status: Home / Self Care | Payer: 59 | Attending: Gastroenterology | Admitting: Gastroenterology

## 2020-08-22 ENCOUNTER — Ambulatory Visit: Payer: 59 | Admitting: Certified Registered Nurse Anesthetist

## 2020-08-22 ENCOUNTER — Other Ambulatory Visit: Payer: Self-pay

## 2020-08-22 ENCOUNTER — Encounter
Admission: RE | Disposition: A | Discharge status: Home / Self Care | Payer: Self-pay | Source: Home / Self Care | Attending: Gastroenterology

## 2020-08-22 DIAGNOSIS — K635 Polyp of colon: Secondary | ICD-10-CM

## 2020-08-22 DIAGNOSIS — Z09 Encounter for follow-up examination after completed treatment for conditions other than malignant neoplasm: Secondary | ICD-10-CM | POA: Diagnosis present

## 2020-08-22 DIAGNOSIS — K5730 Diverticulosis of large intestine without perforation or abscess without bleeding: Secondary | ICD-10-CM | POA: Insufficient documentation

## 2020-08-22 DIAGNOSIS — Z860100 Personal history of colon polyps, unspecified: Secondary | ICD-10-CM

## 2020-08-22 DIAGNOSIS — Z79899 Other long term (current) drug therapy: Secondary | ICD-10-CM | POA: Insufficient documentation

## 2020-08-22 DIAGNOSIS — K6289 Other specified diseases of anus and rectum: Secondary | ICD-10-CM | POA: Diagnosis not present

## 2020-08-22 DIAGNOSIS — Z7982 Long term (current) use of aspirin: Secondary | ICD-10-CM | POA: Insufficient documentation

## 2020-08-22 DIAGNOSIS — Z7984 Long term (current) use of oral hypoglycemic drugs: Secondary | ICD-10-CM | POA: Diagnosis not present

## 2020-08-22 HISTORY — PX: COLONOSCOPY WITH PROPOFOL: SHX5780

## 2020-08-22 LAB — GLUCOSE, CAPILLARY: Glucose-Capillary: 106 mg/dL — ABNORMAL HIGH (ref 70–99)

## 2020-08-22 SURGERY — COLONOSCOPY WITH PROPOFOL
Anesthesia: General

## 2020-08-22 MED ORDER — PROPOFOL 500 MG/50ML IV EMUL
INTRAVENOUS | Status: AC
  Filled 2020-08-22: qty 50

## 2020-08-22 MED ORDER — LIDOCAINE HCL (PF) 2 % IJ SOLN
INTRAMUSCULAR | Status: AC
  Filled 2020-08-22: qty 10

## 2020-08-22 MED ORDER — PROPOFOL 10 MG/ML IV BOLUS
INTRAVENOUS | Status: DC | PRN
  Administered 2020-08-22: 70 mg via INTRAVENOUS

## 2020-08-22 MED ORDER — SODIUM CHLORIDE 0.9 % IV SOLN: INTRAVENOUS | Status: DC

## 2020-08-22 MED ORDER — PROPOFOL 500 MG/50ML IV EMUL
INTRAVENOUS | Status: DC | PRN
  Administered 2020-08-22: 160 ug/kg/min via INTRAVENOUS

## 2020-08-22 NOTE — Anesthesia Postprocedure Evaluation (Signed)
Anesthesia Post Note  Patient: Julia Wood  Procedure(s) Performed: COLONOSCOPY WITH PROPOFOL (N/A )  Patient location during evaluation: PACU Anesthesia Type: General Level of consciousness: awake and alert, awake and oriented Pain management: pain level controlled Vital Signs Assessment: post-procedure vital signs reviewed and stable Respiratory status: spontaneous breathing, nonlabored ventilation and respiratory function stable Cardiovascular status: blood pressure returned to baseline and stable Postop Assessment: no apparent nausea or vomiting Anesthetic complications: no   No complications documented.   Last Vitals:  Vitals:   08/22/20 1116 08/22/20 1126  BP: (!) 146/74 (!) 142/73  Pulse: 62 (!) 59  Resp: 16 15  Temp:    SpO2: 100% 98%    Last Pain:  Vitals:   08/22/20 1126  TempSrc:   PainSc: 0-No pain                 Phill Mutter

## 2020-08-22 NOTE — Transfer of Care (Signed)
Immediate Anesthesia Transfer of Care Note  Patient: Julia Wood  Procedure(s) Performed: COLONOSCOPY WITH PROPOFOL (N/A )  Patient Location: PACU  Anesthesia Type:General  Level of Consciousness: drowsy  Airway & Oxygen Therapy: Spontaneously breathing and nasal cannula  Post-op Assessment: Report given to RN and Post -op Vital signs reviewed and stable  Post vital signs: Reviewed and stable  Last Vitals:  Vitals Value Taken Time  BP    Temp    Pulse 65 08/22/20 1056  Resp 18 08/22/20 1056  SpO2 98 % 08/22/20 1056    Last Pain:  Vitals:   08/22/20 0925  TempSrc: Temporal  PainSc: 0-No pain         Complications: No complications documented.

## 2020-08-22 NOTE — Op Note (Signed)
[facility] Gastroenterology Patient Name: Julia Wood Procedure Date: 08/22/2020 10:12 AM MRN: [id number] Account #: [id number] Date of Birth: 30-Nov-1960 Admit Type: Outpatient Age: 60 Room: [facility] ENDO ROOM 3 Gender: Female Note Status: Finalized Procedure:             Colonoscopy Indications:           High risk colon cancer surveillance: Personal history                         of colonic polyps Providers:             Kimblery Diop B. Bonna Gains MD, MD Referring MD:          Marnee Guarneri, NP Medicines:             General Anesthesia Complications:         No immediate complications. Procedure:             Pre-Anesthesia Assessment:                        - Prior to the procedure, a History and Physical was                         performed, and patient medications and allergies were                         reviewed. The patient is competent. The risks and                         benefits of the procedure and the sedation options and                         risks were discussed with the patient. All questions                         were answered and informed consent was obtained.                         Patient identification and proposed procedure were                         verified by the physician, the nurse, the                         anesthesiologist, the anesthetist and the technician                         in the pre-procedure area in the procedure room in the                         endoscopy suite. Mental Status Examination: alert and                         oriented. Airway Examination: normal oropharyngeal                         airway and neck mobility. Respiratory Examination:  clear to auscultation. CV Examination: normal.                         Prophylactic Antibiotics: The patient does not require                         prophylactic antibiotics. Prior Anticoagulants: The                         patient has taken no  previous anticoagulant or                         antiplatelet agents. ASA Grade Assessment: II - A                         patient with mild systemic disease. After reviewing                         the risks and benefits, the patient was deemed in                         satisfactory condition to undergo the procedure. The                         anesthesia plan was to use general anesthesia.                         Immediately prior to administration of medications,                         the patient was re-assessed for adequacy to receive                         sedatives. The heart rate, respiratory rate, oxygen                         saturations, blood pressure, adequacy of pulmonary                         ventilation, and response to care were monitored                         throughout the procedure. The physical status of the                         patient was re-assessed after the procedure.                        After obtaining informed consent, the colonoscope was                         passed under direct vision. Throughout the procedure,                         the patient's blood pressure, pulse, and oxygen                         saturations were monitored continuously. The  Colonoscope was introduced through the anus and                         advanced to the the terminal ileum. The colonoscopy                         was performed with ease. The patient tolerated the                         procedure well. The quality of the bowel preparation                         was fair, with stool clogging the suction channel. Findings:      The perianal and digital rectal examinations were normal.      Two sessile polyps were found in the sigmoid colon. The polyps were 3 to       6 mm in size. These polyps were removed with a cold snare. Resection and       retrieval were complete.      Multiple diverticula were found in the sigmoid colon.      Anal  papilla(e) were hypertrophied. Impression:            - Two 3 to 6 mm polyps in the sigmoid colon, removed                         with a cold snare. Resected and retrieved.                        - Diverticulosis in the sigmoid colon.                        - Anal papilla(e) were hypertrophied. Recommendation:        - Repeat colonoscopy within 6 months, with 2 day prep,                         because the bowel preparation was suboptimal.                        - Return to my office in 4 months.                        - Continue present medications.                        - Await pathology results.                        - The findings and recommendations were discussed with                         the patient.                        - The findings and recommendations were discussed with                         the patient's family. Procedure Code(s):     --- Professional ---  45385, Colonoscopy, flexible; with removal of                         tumor(s), polyp(s), or other lesion(s) by snare                         technique Diagnosis Code(s):     --- Professional ---                        Z86.010, Personal history of colonic polyps                        K63.5, Polyp of colon CPT copyright 2019 American Medical Association. All rights reserved. The codes documented in this report are preliminary and upon coder review may  be revised to meet current compliance requirements.  Vonda Antigua, MD Margretta Sidle B. Bonna Gains MD, MD 08/22/2020 11:00:52 AM This report has been signed electronically. Number of Addenda: 0 Note Initiated On: 08/22/2020 10:12 AM Scope Withdrawal Time: 0 hours 21 minutes 2 seconds  Total Procedure Duration: 0 hours 26 minutes 19 seconds  Estimated Blood Loss:  Estimated blood loss: none.      [facility]

## 2020-08-22 NOTE — H&P (Signed)
Julia Antigua, MD [address], [place], [place], [place], [address] [address], [place], [place], [place], [address] Phone: [phone]  Fax: [phone]  Primary Care Physician:  Venita Lick, NP   Pre-Procedure History & Physical: HPI:  Julia Wood is a 60 y.o. female is here for a colonoscopy.   Past Medical History:  Diagnosis Date  . Diabetes mellitus without complication ([place])   . Hyperlipidemia   . Hypertension     Past Surgical History:  Procedure Laterality Date  . COLONOSCOPY WITH PROPOFOL N/A 09/29/2018   Procedure: COLONOSCOPY WITH PROPOFOL;  Surgeon: Virgel Manifold, MD;  Location: [facility] ENDOSCOPY;  Service: Endoscopy;  Laterality: N/A;  . STOMACH SURGERY      Prior to Admission medications   Medication Sig Start Date End Date Taking? Authorizing Provider  amLODipine (NORVASC) 5 MG tablet Take 1 tablet (5 mg total) by mouth daily. 04/05/20  Yes Cannady, Henrine Screws T, NP  aspirin EC 81 MG tablet Take 81 mg by mouth daily.   Yes [provider]  gabapentin (NEURONTIN) 300 MG capsule Take 2 capsules (600 mg total) by mouth at bedtime. Take this around 3 am when you go to bed. 07/09/20  Yes Cannady, Jolene T, NP  hydrochlorothiazide (HYDRODIURIL) 25 MG tablet Take 1 tablet (25 mg total) by mouth daily. 04/05/20  Yes Cannady, Henrine Screws T, NP  metFORMIN (GLUCOPHAGE) 500 MG tablet Take 1 tablet (500 mg total) by mouth 2 (two) times daily with a meal. 04/05/20  Yes Cannady, Jolene T, NP  rosuvastatin (CRESTOR) 10 MG tablet Take 1 tablet (10 mg total) by mouth daily. 04/05/20  Yes Cannady, Jolene T, NP  blood glucose meter kit and supplies KIT Dispense based on patient and insurance preference. Use up to four times daily as directed. (FOR ICD-9 250.00, 250.01). 03/24/19   Marnee Guarneri T, NP    Allergies as of 07/18/2020  . (No Known Allergies)    Family History  Problem Relation Age of Onset  . Hypertension Mother     Social History    Socioeconomic History  . Marital status: Single    Spouse name: Not on file  . Number of children: Not on file  . Years of education: Not on file  . Highest education level: Not on file  Occupational History  . Not on file  Tobacco Use  . Smoking status: Never Smoker  . Smokeless tobacco: Never Used  Vaping Use  . Vaping Use: Never used  Substance and Sexual Activity  . Alcohol use: Never  . Drug use: Never  . Sexual activity: Not Currently  Other Topics Concern  . Not on file  Social History Narrative  . Not on file   Social Determinants of Health   Financial Resource Strain: Low Risk   . Difficulty of Paying Living Expenses: Not very hard  Food Insecurity: No Food Insecurity  . Worried About Charity fundraiser in the Last Year: Never true  . Ran Out of Food in the Last Year: Never true  Transportation Needs: No Transportation Needs  . Lack of Transportation (Medical): No  . Lack of Transportation (Non-Medical): No  Physical Activity: Not on file  Stress: No Stress Concern Present  . Feeling of Stress : Not at all  Social Connections: Unknown  . Frequency of Communication with Friends and Family: More than three times a week  . Frequency of Social Gatherings with Friends and Family: More than three times a week  .  Attends Religious Services: 1 to 4 times per year  . Active Member of Clubs or Organizations: No  . Attends Archivist Meetings: Never  . Marital Status: Not on file  Intimate Partner Violence: Not At Risk  . Fear of Current or Ex-Partner: No  . Emotionally Abused: No  . Physically Abused: No  . Sexually Abused: No    Review of Systems: See HPI, otherwise negative ROS  Physical Exam: BP (!) 147/86   Pulse 68   Temp 98.2 F (36.8 C) (Temporal)   Resp 18   Ht '5\' 3"'  (1.6 m)   Wt 84.8 kg   LMP  (LMP Unknown)   SpO2 99%   BMI 33.13 kg/m  General:   Alert,  pleasant and cooperative in NAD Head:  Normocephalic and atraumatic. Neck:   Supple; no masses or thyromegaly. Lungs:  Clear throughout to auscultation, normal respiratory effort.    Heart:  +S1, +S2, Regular rate and rhythm, No edema. Abdomen:  Soft, nontender and nondistended. Normal bowel sounds, without guarding, and without rebound.   Neurologic:  Alert and  oriented x4;  grossly normal neurologically.  Impression/Plan: Julia Wood is here for a colonoscopy to be performed for history of polyps.  Risks, benefits, limitations, and alternatives regarding  colonoscopy have been reviewed with the patient.  Questions have been answered.  All parties agreeable.   Virgel Manifold, MD  08/22/2020, 9:43 AM

## 2020-08-22 NOTE — Anesthesia Preprocedure Evaluation (Signed)
Anesthesia Evaluation  Patient identified by MRN, date of birth, ID band Patient awake    Reviewed: Allergy & Precautions, H&P , NPO status , Patient's Chart, lab work & pertinent test results, reviewed documented beta blocker date and time   Airway Mallampati: II  TM Distance: >3 FB Neck ROM: full    Dental  (+) Poor Dentition, Partial Upper   Pulmonary neg pulmonary ROS,    Pulmonary exam normal        Cardiovascular Exercise Tolerance: Good hypertension, Pt. on medications negative cardio ROS Normal cardiovascular exam Rhythm:regular Rate:Normal     Neuro/Psych  Neuromuscular disease negative psych ROS   GI/Hepatic negative GI ROS, Neg liver ROS,   Endo/Other  negative endocrine ROSdiabetes, Well Controlled, Type 2, Oral Hypoglycemic Agents  Renal/GU negative Renal ROS  negative genitourinary   Musculoskeletal   Abdominal   Peds  Hematology negative hematology ROS (+)   Anesthesia Other Findings Past Medical History: No date: Diabetes mellitus without complication (H[facility]) No date: Hyperlipidemia No date: Hypertension Past Surgical History: No date: STOMACH SURGERY BMI    Body Mass Index:  31.71 kg/m     Reproductive/Obstetrics negative OB ROS                             Anesthesia Physical  Anesthesia Plan  ASA: II  Anesthesia Plan: General   Post-op Pain Management:    Induction: Intravenous  PONV Risk Score and Plan: 2  Airway Management Planned: Nasal Cannula  Additional Equipment:   Intra-op Plan:   Post-operative Plan:   Informed Consent: I have reviewed the patients History and Physical, chart, labs and discussed the procedure including the risks, benefits and alternatives for the proposed anesthesia with the patient or authorized representative who has indicated his/her understanding and acceptance.       Plan Discussed with: CRNA, Anesthesiologist and  Surgeon  Anesthesia Plan Comments:         Anesthesia Quick Evaluation

## 2020-08-22 NOTE — Anesthesia Procedure Notes (Signed)
Performed by: Layci Stenglein, CRNA °Pre-anesthesia Checklist: Patient identified, Emergency Drugs available, Suction available, Patient being monitored and Timeout performed °Patient Re-evaluated:Patient Re-evaluated prior to induction °Oxygen Delivery Method: Nasal cannula °Placement Confirmation: CO2 detector and positive ETCO2 ° ° ° ° ° ° °

## 2020-08-23 ENCOUNTER — Encounter: Payer: Self-pay | Admitting: Gastroenterology

## 2020-08-23 LAB — SURGICAL PATHOLOGY

## 2020-09-03 ENCOUNTER — Encounter: Payer: Self-pay | Admitting: Gastroenterology

## 2020-09-17 ENCOUNTER — Ambulatory Visit: Payer: Self-pay | Admitting: General Practice

## 2020-09-17 ENCOUNTER — Telehealth: Payer: Self-pay | Admitting: General Practice

## 2020-09-17 DIAGNOSIS — I152 Hypertension secondary to endocrine disorders: Secondary | ICD-10-CM

## 2020-09-17 DIAGNOSIS — R809 Proteinuria, unspecified: Secondary | ICD-10-CM

## 2020-09-17 DIAGNOSIS — E1159 Type 2 diabetes mellitus with other circulatory complications: Secondary | ICD-10-CM

## 2020-09-17 DIAGNOSIS — E1129 Type 2 diabetes mellitus with other diabetic kidney complication: Secondary | ICD-10-CM

## 2020-09-17 NOTE — Chronic Care Management (AMB) (Signed)
Care Management    RN Visit Note  09/17/2020 Name: MOLLEE NEER MRN: [id number] DOB: [birth year]  Subjective: ARSHIYA JAKES is a 60 y.o. year old female who is a primary care patient of Cannady, Barbaraann Faster, NP. The care management team was consulted for assistance with disease management and care coordination needs.    Engaged with patient by telephone for follow up visit in response to provider referral for case management and/or care coordination services.   Consent to Services:   Ms. Seely was given information about Care Management services today including:  1. Care Management services includes personalized support from designated clinical staff supervised by her physician, including individualized plan of care and coordination with other care providers 2. 24/7 contact phone numbers for assistance for urgent and routine care needs. 3. The patient may stop case management services at any time by phone call to the office staff.  Patient agreed to services and consent obtained.   Assessment: Review of patient past medical history, allergies, medications, health status, including review of consultants reports, laboratory and other test data, was performed as part of comprehensive evaluation and provision of chronic care management services.   SDOH (Social Determinants of Health) assessments and interventions performed:    Care Plan  No Known Allergies  Outpatient Encounter Medications as of 09/17/2020  Medication Sig   amLODipine (NORVASC) 5 MG tablet Take 1 tablet (5 mg total) by mouth daily.   aspirin EC 81 MG tablet Take 81 mg by mouth daily.   blood glucose meter kit and supplies KIT Dispense based on patient and insurance preference. Use up to four times daily as directed. (FOR ICD-9 250.00, 250.01).   gabapentin (NEURONTIN) 300 MG capsule Take 2 capsules (600 mg total) by mouth at bedtime. Take this around 3 am when you go to bed.   hydrochlorothiazide (HYDRODIURIL) 25  MG tablet Take 1 tablet (25 mg total) by mouth daily.   metFORMIN (GLUCOPHAGE) 500 MG tablet Take 1 tablet (500 mg total) by mouth 2 (two) times daily with a meal.   rosuvastatin (CRESTOR) 10 MG tablet Take 1 tablet (10 mg total) by mouth daily.   No facility-administered encounter medications on file as of 09/17/2020.    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[place]) 08/17/2018   Hypertension associated with diabetes ([place]) 09/17/2017   Hyperlipidemia associated with type 2 diabetes mellitus ([place]) 09/17/2017    Conditions to be addressed/monitored: HTN and DMII  Care Plan : RNCM: Diabetes Type 2 (Adult)  Updates made by Vanita Ingles since 09/17/2020 12:00 AM    Problem: RNCM: Glycemic Management (Diabetes, Type 2)   Priority: Medium    Goal: RNCM: Glycemic Management Optimized   Priority: Medium  Note:   Objective:  Lab Results  Component Value Date   HGBA1C 7.0 (H) 07/09/2020    Lab Results  Component Value Date   CREATININE 0.85 04/05/2020   CREATININE 0.81 09/22/2019   CREATININE 0.74 01/06/2019     No results found for: EGFR Current Barriers:   Knowledge Deficits related to basic Diabetes  pathophysiology and self care/management  Knowledge Deficits related to medications used for management of diabetes  Limited Social Support  Lacks social connections  Does not contact provider office for questions/concerns Case Manager Clinical Goal(s):   patient will demonstrate improved adherence to prescribed treatment plan for diabetes self care/management as evidenced by:  daily monitoring and recording of CBG  adherence to ADA/ carb modified diet adherence to prescribed medication regimen contacting provider for new or worsened symptoms or questions Interventions:   Collaboration with Venita Lick, NP regarding development and update of comprehensive plan of care as evidenced by provider attestation and co-signature  Inter-disciplinary care team collaboration (see longitudinal plan of care)  Provided education to patient about basic DM disease process  Reviewed medications with patient and discussed importance of medication adherence  Discussed plans with patient for ongoing care management follow up and provided patient with direct contact information for care management team  Provided patient with written educational materials related to hypo and hyperglycemia and importance of correct treatment  Reviewed scheduled/upcoming provider appointments including: 10-09-2020  Advised patient, providing education and rationale, to check cbg bid and record, calling pcp for findings outside established parameters.    Review of patient status, including review of consultants reports, relevant laboratory and other test results, and medications completed. Patient Goals/Self-Care Activities - Self administers oral medications as prescribed Attends all scheduled provider appointments Checks blood sugars as prescribed and utilize hyper and hypoglycemia protocol as needed Adheres to prescribed ADA/carb modified - barriers to adherence to treatment plan identified - blood glucose monitoring encouraged - blood glucose readings reviewed - mutual A1C goal set or reviewed - resources required to improve adherence to care identified - self-awareness of signs/symptoms of hypo or hyperglycemia encouraged - use of blood glucose monitoring log promoted Follow Up Plan: Telephone follow up appointment with care management team member scheduled for: 11-12-2020 at 345 pm    Task: RNCM: Alleviate Barriers to Glycemic Management   Note:   Care Management Activities:    - barriers to adherence to treatment plan identified - blood glucose monitoring encouraged - blood glucose readings reviewed - mutual A1C goal set or reviewed - resources required to improve adherence to care identified - self-awareness of signs/symptoms of hypo or hyperglycemia encouraged - use of blood glucose monitoring log promoted       Care Plan : RNCM: Hypertension (Adult)  Updates made by Vanita Ingles since 09/17/2020 12:00 AM    Problem: RNCM: Hypertension (Hypertension)   Priority: Medium    Long-Range Goal: Hypertension Monitored   Priority: Medium  Note:   Objective:   Last practice recorded BP readings:  BP Readings from Last 3 Encounters:  08/22/20 (!) 142/73  07/09/20 128/80  04/05/20 133/84     Most recent eGFR/CrCl: No results found for: EGFR  No components found for: CRCL Current Barriers:   Knowledge Deficits related to basic understanding of hypertension pathophysiology and self care management  Knowledge Deficits related to understanding of medications prescribed for management of hypertension  Limited Social Support  Lacks social connections  Does not contact provider office for questions/concerns Case Manager Clinical Goal(s):   Over the next 120 days, patient will verbalize understanding of plan for hypertension management  Over the next 120 days, patient will attend all scheduled medical appointments: 10-09-2020 at 0920 am  Over the next 120 days, patient will demonstrate improved adherence to prescribed treatment plan for hypertension as evidenced by taking all medications as prescribed, monitoring and  recording blood pressure as directed, adhering to low sodium/DASH diet  Over the next 120 days, patient will demonstrate improved health management independence as evidenced by checking blood pressure as directed and notifying PCP if SBP>160 or  DBP > 90, taking all medications as prescribe, and adhering to a low sodium diet as discussed.  Over the next 120 days, patient will verbalize basic understanding of hypertension disease process and self health management plan as evidenced by compliance with medications, heart healthy diet, and working with CCM team to effectively manage health and well being  Interventions:   Collaboration with Venita Lick, NP regarding development and update of comprehensive plan of care as evidenced by provider attestation and co-signature  Inter-disciplinary care team collaboration (see longitudinal plan of care)  Evaluation of current treatment plan related to hypertension self management and patient's adherence to plan as established by provider.  Provided education to patient re: stroke prevention, s/s of heart attack and stroke, DASH diet, complications of uncontrolled blood pressure  Reviewed medications with patient and discussed importance of compliance  Discussed plans with patient for ongoing care management follow up and provided patient with direct contact information for care management team  Advised patient, providing education and rationale, to monitor blood pressure daily and record, calling PCP for findings outside established parameters.   Reviewed scheduled/upcoming provider appointments including:  10-09-2020 at 0920 am Patient Goals/Self-Care Activities  Over the next 120 days, patient will:  - Self administers medications as prescribed Attends all scheduled provider appointments Calls provider office for new concerns, questions, or BP outside discussed parameters Checks BP and records as discussed Follows a low sodium diet/DASH diet - blood pressure trends reviewed - depression screen reviewed - home or ambulatory blood pressure monitoring encouraged Follow Up Plan: Telephone follow up appointment with care management team member scheduled for: 11-12-2020 at 345 pm   Task:  RNCM: Identify and Monitor Blood Pressure Elevation   Note:   Care Management Activities:    - blood pressure trends reviewed - depression screen reviewed - home or ambulatory blood pressure monitoring encouraged         Plan: Telephone follow up appointment with care management team member scheduled for:  11-12-2020 at 3:45 pm  [place], MSN, [facility] Mobile: [phone]

## 2020-09-17 NOTE — Patient Instructions (Signed)
Visit Information  Patient Care Plan: RNCM: Diabetes Type 2 (Adult)    Problem Identified: RNCM: Glycemic Management (Diabetes, Type 2)   Priority: Medium    Goal: RNCM: Glycemic Management Optimized   Priority: Medium  Note:   Objective:  Lab Results  Component Value Date   HGBA1C 7.0 (H) 07/09/2020 .   Lab Results  Component Value Date   CREATININE 0.85 04/05/2020   CREATININE 0.81 09/22/2019   CREATININE 0.74 01/06/2019 .   Marland Kitchen No results found for: EGFR Current Barriers:  Marland Kitchen Knowledge Deficits related to basic Diabetes pathophysiology and self care/management . Knowledge Deficits related to medications used for management of diabetes . Limited Social Support . Lacks social connections . Does not contact provider office for questions/concerns Case Manager Clinical Goal(s):  . patient will demonstrate improved adherence to prescribed treatment plan for diabetes self care/management as evidenced by: daily monitoring and recording of CBG  adherence to ADA/ carb modified diet adherence to prescribed medication regimen contacting provider for new or worsened symptoms or questions Interventions:  . Collaboration with Venita Lick, NP regarding development and update of comprehensive plan of care as evidenced by provider attestation and co-signature . Inter-disciplinary care team collaboration (see longitudinal plan of care) . Provided education to patient about basic DM disease process . Reviewed medications with patient and discussed importance of medication adherence . Discussed plans with patient for ongoing care management follow up and provided patient with direct contact information for care management team . Provided patient with written educational materials related to hypo and hyperglycemia and importance of correct treatment . Reviewed scheduled/upcoming provider appointments including: 10-09-2020 . Advised patient, providing education and rationale, to check cbg bid and  record, calling pcp for findings outside established parameters.   . Review of patient status, including review of consultants reports, relevant laboratory and other test results, and medications completed. Patient Goals/Self-Care Activities - Self administers oral medications as prescribed Attends all scheduled provider appointments Checks blood sugars as prescribed and utilize hyper and hypoglycemia protocol as needed Adheres to prescribed ADA/carb modified - barriers to adherence to treatment plan identified - blood glucose monitoring encouraged - blood glucose readings reviewed - mutual A1C goal set or reviewed - resources required to improve adherence to care identified - self-awareness of signs/symptoms of hypo or hyperglycemia encouraged - use of blood glucose monitoring log promoted Follow Up Plan: Telephone follow up appointment with care management team member scheduled for: 11-12-2020 at 345 pm   Task: RNCM: Alleviate Barriers to Glycemic Management   Note:   Care Management Activities:    - barriers to adherence to treatment plan identified - blood glucose monitoring encouraged - blood glucose readings reviewed - mutual A1C goal set or reviewed - resources required to improve adherence to care identified - self-awareness of signs/symptoms of hypo or hyperglycemia encouraged - use of blood glucose monitoring log promoted       Patient Care Plan: RNCM: Hypertension (Adult)    Problem Identified: RNCM: Hypertension (Hypertension)   Priority: Medium    Long-Range Goal: Hypertension Monitored   Priority: Medium  Note:   Objective:  . Last practice recorded BP readings:  BP Readings from Last 3 Encounters:  08/22/20 (!) 142/73  07/09/20 128/80  04/05/20 133/84 .   Marland Kitchen Most recent eGFR/CrCl: No results found for: EGFR  No components found for: CRCL Current Barriers:  Marland Kitchen Knowledge Deficits related to basic understanding of hypertension pathophysiology and self care  management . Knowledge Deficits related  to understanding of medications prescribed for management of hypertension . Limited Social Support . Lacks social connections . Does not contact provider office for questions/concerns Case Manager Clinical Goal(s):  Marland Kitchen Over the next 120 days, patient will verbalize understanding of plan for hypertension management . Over the next 120 days, patient will attend all scheduled medical appointments: 10-09-2020 at 0920 am . Over the next 120 days, patient will demonstrate improved adherence to prescribed treatment plan for hypertension as evidenced by taking all medications as prescribed, monitoring and recording blood pressure as directed, adhering to low sodium/DASH diet . Over the next 120 days, patient will demonstrate improved health management independence as evidenced by checking blood pressure as directed and notifying PCP if SBP>160 or DBP > 90, taking all medications as prescribe, and adhering to a low sodium diet as discussed. . Over the next 120 days, patient will verbalize basic understanding of hypertension disease process and self health management plan as evidenced by compliance with medications, heart healthy diet, and working with CCM team to effectively manage health and well being  Interventions:  . Collaboration with Venita Lick, NP regarding development and update of comprehensive plan of care as evidenced by provider attestation and co-signature . Inter-disciplinary care team collaboration (see longitudinal plan of care) . Evaluation of current treatment plan related to hypertension self management and patient's adherence to plan as established by provider. . Provided education to patient re: stroke prevention, s/s of heart attack and stroke, DASH diet, complications of uncontrolled blood pressure . Reviewed medications with patient and discussed importance of compliance . Discussed plans with patient for ongoing care management follow up  and provided patient with direct contact information for care management team . Advised patient, providing education and rationale, to monitor blood pressure daily and record, calling PCP for findings outside established parameters.  . Reviewed scheduled/upcoming provider appointments including:  10-09-2020 at 0920 am Patient Goals/Self-Care Activities . Over the next 120 days, patient will:  - Self administers medications as prescribed Attends all scheduled provider appointments Calls provider office for new concerns, questions, or BP outside discussed parameters Checks BP and records as discussed Follows a low sodium diet/DASH diet - blood pressure trends reviewed - depression screen reviewed - home or ambulatory blood pressure monitoring encouraged Follow Up Plan: Telephone follow up appointment with care management team member scheduled for: 11-12-2020 at 345 pm   Task: RNCM: Identify and Monitor Blood Pressure Elevation   Note:   Care Management Activities:    - blood pressure trends reviewed - depression screen reviewed - home or ambulatory blood pressure monitoring encouraged         The patient verbalized understanding of instructions, educational materials, and care plan provided today and declined offer to receive copy of patient instructions, educational materials, and care plan.   Telephone follow up appointment with care management team member scheduled for: 10-09-2020 at [place] am  Noreene Larsson RN, MSN, [facility] Mobile: [phone]

## 2020-10-09 ENCOUNTER — Encounter: Payer: Self-pay | Admitting: Nurse Practitioner

## 2020-10-09 ENCOUNTER — Other Ambulatory Visit: Payer: Self-pay

## 2020-10-09 ENCOUNTER — Ambulatory Visit (INDEPENDENT_AMBULATORY_CARE_PROVIDER_SITE_OTHER): Payer: 59 | Admitting: Nurse Practitioner

## 2020-10-09 VITALS — BP 134/77 | HR 71 | Temp 98.3°F | Wt 185.2 lb

## 2020-10-09 DIAGNOSIS — E785 Hyperlipidemia, unspecified: Secondary | ICD-10-CM

## 2020-10-09 DIAGNOSIS — E1159 Type 2 diabetes mellitus with other circulatory complications: Secondary | ICD-10-CM | POA: Diagnosis not present

## 2020-10-09 DIAGNOSIS — E1142 Type 2 diabetes mellitus with diabetic polyneuropathy: Secondary | ICD-10-CM

## 2020-10-09 DIAGNOSIS — E1169 Type 2 diabetes mellitus with other specified complication: Secondary | ICD-10-CM | POA: Diagnosis not present

## 2020-10-09 DIAGNOSIS — I152 Hypertension secondary to endocrine disorders: Secondary | ICD-10-CM

## 2020-10-09 DIAGNOSIS — E669 Obesity, unspecified: Secondary | ICD-10-CM

## 2020-10-09 DIAGNOSIS — E1129 Type 2 diabetes mellitus with other diabetic kidney complication: Secondary | ICD-10-CM

## 2020-10-09 DIAGNOSIS — R809 Proteinuria, unspecified: Secondary | ICD-10-CM

## 2020-10-09 LAB — MICROALBUMIN, URINE WAIVED
Creatinine, Urine Waived: 200 mg/dL (ref 10–300)
Microalb, Ur Waived: 80 mg/L — ABNORMAL HIGH (ref 0–19)

## 2020-10-09 LAB — BAYER DCA HB A1C WAIVED: HB A1C (BAYER DCA - WAIVED): 7.1 % — ABNORMAL HIGH (ref <7.0)

## 2020-10-09 NOTE — Assessment & Plan Note (Signed)
Chronic, ongoing.  A1C 7.1% today, slight trend upwards due to poor diet choices.  Urine ALB 80 and A:C 30-300 today -- unable to take ACE or ARB due to history of cough.  Continue current regimen and adjust as needed, no current ACR or ARB due to past cough with Losartan.  CMP today. Continue checking BS at least fasting once weekly and document.  Diabetic diet focus, discussed with patient if ongoing trend upwards next visit will need to increase Metformin.  Return in 3 months.

## 2020-10-09 NOTE — Assessment & Plan Note (Signed)
Chronic, ongoing with BP at goal in office and at home.  Urine ALB 80 and A:C 30-300 today -- unable to take ACE or ARB due to history of cough.  Continue current regimen and adjust as needed, no current ACR or ARB due to past cough with Losartan.  CMP today. Return in 3 months.

## 2020-10-09 NOTE — Assessment & Plan Note (Signed)
BMI 32.81.  Recommended eating smaller high protein, low fat meals more frequently and exercising 30 mins a day 5 times a week with a goal of 10-15lb weight loss in the next 3 months. Patient voiced their understanding and motivation to adhere to these recommendations.   

## 2020-10-09 NOTE — Patient Instructions (Signed)
[facility]  Address: [address], [place], [place] [address]  Phone: [phone]   Diabetes Mellitus and Nutrition, Adult When you have diabetes, or diabetes mellitus, it is very important to have healthy eating habits because your blood sugar (glucose) levels are greatly affected by what you eat and drink. Eating healthy foods in the right amounts, at about the same times every day, can help you: Control your blood glucose. Lower your risk of heart disease. Improve your blood pressure. Reach or maintain a healthy weight. What can affect my meal plan? Every person with diabetes is different, and each person has different needs for a meal plan. Your health care provider may recommend that you work with a dietitian to make a meal plan that is best for you. Your meal plan may vary depending on factors such as: The calories you need. The medicines you take. Your weight. Your blood glucose, blood pressure, and cholesterol levels. Your activity level. Other health conditions you have, such as heart or kidney disease. How do carbohydrates affect me? Carbohydrates, also called carbs, affect your blood glucose level more than any other type of food. Eating carbs naturally raises the amount of glucose in your blood. Carb counting is a method for keeping track of how many carbs you eat. Counting carbs is important to keep your blood glucose at a healthy level, especially if you use insulin or take certain oral diabetes medicines. It is important to know how many carbs you can safely have in each meal. This is different for every person. Your dietitian can help you calculate how many carbs you should have at each meal and for each snack. How does alcohol affect me? Alcohol can cause a sudden decrease in blood glucose (hypoglycemia), especially if you use insulin or take certain oral diabetes medicines. Hypoglycemia can be a life-threatening condition. Symptoms of  hypoglycemia, such as sleepiness, dizziness, and confusion, are similar to symptoms of having too much alcohol. Do not drink alcohol if: Your health care provider tells you not to drink. You are pregnant, may be pregnant, or are planning to become pregnant. If you drink alcohol: Do not drink on an empty stomach. Limit how much you use to: 0-1 drink a day for women. 0-2 drinks a day for men. Be aware of how much alcohol is in your drink. In the [place] one drink equals one 12 oz bottle of beer (355 mL), one 5 oz glass of wine (148 mL), or one 1 oz glass of hard liquor (44 mL). Keep yourself hydrated with water, diet soda, or unsweetened iced tea. Keep in mind that regular soda, juice, and other mixers may contain a lot of sugar and must be counted as carbs. What are tips for following this plan? Reading food labels Start by checking the serving size on the "Nutrition Facts" label of packaged foods and drinks. The amount of calories, carbs, fats, and other nutrients listed on the label is based on one serving of the item. Many items contain more than one serving per package. Check the total grams (g) of carbs in one serving. You can calculate the number of servings of carbs in one serving by dividing the total carbs by 15. For example, if a food has 30 g of total carbs per serving, it would be equal to 2 servings of carbs. Check the number of grams (g) of saturated fats and trans fats in one serving. Choose foods that have a low   amount or none of these fats. Check the number of milligrams (mg) of salt (sodium) in one serving. Most people should limit total sodium intake to less than 2,300 mg per day. Always check the nutrition information of foods labeled as "low-fat" or "nonfat." These foods may be higher in added sugar or refined carbs and should be avoided. Talk to your dietitian to identify your daily goals for nutrients listed on the label. Shopping Avoid buying canned, pre-made, or processed  foods. These foods tend to be high in fat, sodium, and added sugar. Shop around the outside edge of the grocery store. This is where you will most often find fresh fruits and vegetables, bulk grains, fresh meats, and fresh dairy. Cooking Use low-heat cooking methods, such as baking, instead of high-heat cooking methods like deep frying. Cook using healthy oils, such as olive, canola, or sunflower oil. Avoid cooking with butter, cream, or high-fat meats. Meal planning Eat meals and snacks regularly, preferably at the same times every day. Avoid going long periods of time without eating. Eat foods that are high in fiber, such as fresh fruits, vegetables, beans, and whole grains. Talk with your dietitian about how many servings of carbs you can eat at each meal. Eat 4-6 oz (112-168 g) of lean protein each day, such as lean meat, chicken, fish, eggs, or tofu. One ounce (oz) of lean protein is equal to: 1 oz (28 g) of meat, chicken, or fish. 1 egg.  cup (62 g) of tofu. Eat some foods each day that contain healthy fats, such as avocado, nuts, seeds, and fish. What foods should I eat? Fruits Berries. Apples. Oranges. Peaches. Apricots. Plums. Grapes. Mango. Papaya. Pomegranate. Kiwi. Cherries. Vegetables Lettuce. Spinach. Leafy greens, including kale, chard, collard greens, and mustard greens. Beets. Cauliflower. Cabbage. Broccoli. Carrots. Green beans. Tomatoes. Peppers. Onions. Cucumbers. Brussels sprouts. Grains Whole grains, such as whole-wheat or whole-grain bread, crackers, tortillas, cereal, and pasta. Unsweetened oatmeal. Quinoa. Brown or wild rice. Meats and other proteins Seafood. Poultry without skin. Lean cuts of poultry and beef. Tofu. Nuts. Seeds. Dairy Low-fat or fat-free dairy products such as milk, yogurt, and cheese. The items listed above may not be a complete list of foods and beverages you can eat. Contact a dietitian for more information. What foods should I  avoid? Fruits Fruits canned with syrup. Vegetables Canned vegetables. Frozen vegetables with butter or cream sauce. Grains Refined white flour and flour products such as bread, pasta, snack foods, and cereals. Avoid all processed foods. Meats and other proteins Fatty cuts of meat. Poultry with skin. Breaded or fried meats. Processed meat. Avoid saturated fats. Dairy Full-fat yogurt, cheese, or milk. Beverages Sweetened drinks, such as soda or iced tea. The items listed above may not be a complete list of foods and beverages you should avoid. Contact a dietitian for more information. Questions to ask a health care provider Do I need to meet with a diabetes educator? Do I need to meet with a dietitian? What number can I call if I have questions? When are the best times to check my blood glucose? Where to find more information: American Diabetes Association: diabetes.org Academy of Nutrition and Dietetics: www.eatright.org National Institute of Diabetes and Digestive and Kidney Diseases: www.niddk.nih.gov Association of Diabetes Care and Education Specialists: www.diabeteseducator.org Summary It is important to have healthy eating habits because your blood sugar (glucose) levels are greatly affected by what you eat and drink. A healthy meal plan will help you control your blood glucose and   maintain a healthy lifestyle. Your health care provider may recommend that you work with a dietitian to make a meal plan that is best for you. Keep in mind that carbohydrates (carbs) and alcohol have immediate effects on your blood glucose levels. It is important to count carbs and to use alcohol carefully. This information is not intended to replace advice given to you by your health care provider. Make sure you discuss any questions you have with your health care provider. Document Revised: 06/27/2019 Document Reviewed: 06/27/2019 Elsevier Patient Education  2021 Elsevier Inc.  

## 2020-10-09 NOTE — Assessment & Plan Note (Signed)
Chronic, ongoing.  A1C 7.1% today, slight trend upwards.  Continue Metformin 500 MG BID, but she is aware if ongoing poor diet choices and elevation A1c next visit will need to increase Metformin.  Continue checking BS at least fasting once weekly and document. Will continue Gabapentin 600 MG prior to bed for neuropathy discomfort at work.  Return in 3 months.

## 2020-10-09 NOTE — Progress Notes (Signed)
BP 134/77   Pulse 71   Temp 98.3 F (36.8 C) (Oral)   Wt 185 lb 3.2 oz (84 kg)   LMP  (LMP Unknown)   SpO2 99%   BMI 32.81 kg/m    Subjective:    Patient ID: Julia Wood, female    DOB: June 08, 1961, 60 y.o.   MRN: [id number]  HPI: Julia Wood is a 60 y.o. female  Chief Complaint  Patient presents with  . Hypertension  . Hyperlipidemia  . Diabetes  . Follow-up    Patient denies having any problems or concerns at today's visit.    DIABETES Last A1C 7% in December, initially placed on Metformin in February 2020 with A1C 7.2%.  Taking Metformin one tablet twice a day and Gabapentin 600 MG at bedtime for neuropathy pain.  Does endorse eating more sweets and food lately. Hypoglycemic episodes:no Polydipsia/polyuria: no Visual disturbance: no Chest pain: no Paresthesias: no Glucose Monitoring: yes  Accucheck frequency: rarely  Fasting glucose: 99 twice on checks  Post prandial:  Evening:  Before meals: Taking Insulin?: no  Long acting insulin:  Short acting insulin: Blood Pressure Monitoring: a few times a week Retinal Examination: Not up to Date -- [place] Exam: Up to Date Pneumovax: Not up to Date Influenza: Not Up to Date Aspirin: yes   HYPERTENSION / HYPERLIPIDEMIA Continues on HCTZ, Amlodipine, and Crestor. Satisfied with current treatment? yes Duration of hypertension: chronic BP monitoring frequency: a few times a week BP range: on average 130/70 range BP medication side effects: no Duration of hyperlipidemia: chronic Cholesterol medication side effects: no Cholesterol supplements: none Medication compliance: good compliance Aspirin: yes Recent stressors: no Recurrent headaches: no Visual changes: no Palpitations: no Dyspnea: no Chest pain: no Lower extremity edema: no Dizzy/lightheaded: no    Depression screen [facility] 2/9 10/09/2020 09/22/2019 08/18/2018  Decreased Interest 0 0 0  Down, Depressed, Hopeless 0 0 0  PHQ - 2 Score 0 0 0   Altered sleeping 0 - 1  Tired, decreased energy 0 - 0  Change in appetite 0 - 1  Feeling bad or failure about yourself  0 - 0  Trouble concentrating 0 - 0  Moving slowly or fidgety/restless 0 - 0  Suicidal thoughts 0 - 0  PHQ-9 Score 0 - 2  Difficult doing work/chores - - Not difficult at all   Relevant past medical, surgical, family and social history reviewed and updated as indicated. Interim medical history since our last visit reviewed. Allergies and medications reviewed and updated.  Review of Systems  Constitutional: Negative for activity change, appetite change, diaphoresis, fatigue and fever.  Respiratory: Negative for cough, chest tightness and shortness of breath.   Cardiovascular: Negative for chest pain, palpitations and leg swelling.  Gastrointestinal: Negative.   Endocrine: Negative for cold intolerance, heat intolerance, polydipsia, polyphagia and polyuria.  Neurological: Negative.   Psychiatric/Behavioral: Negative.     Per HPI unless specifically indicated above     Objective:    BP 134/77   Pulse 71   Temp 98.3 F (36.8 C) (Oral)   Wt 185 lb 3.2 oz (84 kg)   LMP  (LMP Unknown)   SpO2 99%   BMI 32.81 kg/m   Wt Readings from Last 3 Encounters:  10/09/20 185 lb 3.2 oz (84 kg)  08/22/20 187 lb (84.8 kg)  07/09/20 180 lb 6.4 oz (81.8 kg)    Physical Exam Vitals and nursing note reviewed.  Constitutional:  General: She is awake. She is not in acute distress.    Appearance: She is well-developed and well-groomed. She is obese. She is not ill-appearing.  HENT:     Head: Normocephalic.     Right Ear: Hearing normal.     Left Ear: Hearing normal.  Eyes:     General: Lids are normal.        Right eye: No discharge.        Left eye: No discharge.     Conjunctiva/sclera: Conjunctivae normal.     Pupils: Pupils are equal, round, and reactive to light.  Neck:     Thyroid: No thyromegaly.     Vascular: No carotid bruit.  Cardiovascular:     Rate  and Rhythm: Normal rate and regular rhythm.     Pulses:          Dorsalis pedis pulses are 2+ on the right side and 2+ on the left side.       Posterior tibial pulses are 2+ on the right side and 2+ on the left side.     Heart sounds: Normal heart sounds. No murmur heard. No gallop.   Pulmonary:     Effort: Pulmonary effort is normal. No accessory muscle usage or respiratory distress.     Breath sounds: Normal breath sounds.  Abdominal:     General: Bowel sounds are normal.     Palpations: Abdomen is soft.  Musculoskeletal:     Cervical back: Normal range of motion and neck supple.     Right lower leg: No edema.     Left lower leg: No edema.  Feet:     Right foot:     Protective Sensation: 10 sites tested. 10 sites sensed.     Skin integrity: No ulcer.     Toenail Condition: Right toenails are normal.     Left foot:     Protective Sensation: 10 sites tested. 10 sites sensed.     Skin integrity: No ulcer.     Toenail Condition: Left toenails are normal.  Skin:    General: Skin is warm and dry.  Neurological:     Mental Status: She is alert and oriented to person, place, and time.  Psychiatric:        Attention and Perception: Attention normal.        Mood and Affect: Mood normal.        Behavior: Behavior normal. Behavior is cooperative.        Thought Content: Thought content normal.        Judgment: Judgment normal.     Diabetic Foot Exam - Simple   Simple Foot Form Visual Inspection No deformities, no ulcerations, no other skin breakdown bilaterally: Yes Sensation Testing Intact to touch and monofilament testing bilaterally: Yes Pulse Check Posterior Tibialis and Dorsalis pulse intact bilaterally: Yes Comments    Results for orders placed or performed during the hospital encounter of 08/22/20  Glucose, capillary  Result Value Ref Range   Glucose-Capillary 106 (H) 70 - 99 mg/dL  Surgical pathology  Result Value Ref Range   SURGICAL PATHOLOGY      SURGICAL  PATHOLOGY CASE: [id number] PATIENT: Julia Wood Surgical Pathology Report     Specimen Submitted: A. Colon polyp x2, sigmoid; cold snare  Clinical History: Personal history of colon polyps.  Sigmoid colon polyps      DIAGNOSIS: A.  COLON POLYP X2, SIGMOID; COLD SNARE: - HYPERPLASTIC POLYP (1). - ONLY ONE TISSUE FRAGMENT WAS IDENTIFIED ON  GROSS AND MICROSCOPIC EXAMINATION. - NEGATIVE FOR DYSPLASIA AND MALIGNANCY.  GROSS DESCRIPTION: A. Labeled: Sigmoid polyp x2, cold snare Received: Formalin Collection time: 10:49 AM on 08/22/2020 Placed into formalin time: 10:49 AM on 08/22/2020 Tissue fragment(s): Multiple Size: Aggregate, 0.6 x 0.4 x 0.2 cm Description: Received is a single fragment of tan soft tissue admixed with fecal matter.  The ratio of soft tissue to fecal matter is 70: 30. Entirely submitted in 1 cassette.   Final Diagnosis performed by Quay Burow, MD.   Electronically signed 08/23/2020 10:19:17AM The electronic signature in dicates that the named Attending Pathologist has evaluated the specimen Technical component performed at [facility], [address], [place], [place] [address] Lab: [phone] Dir: Rush Farmer, MD, MMM  Professional component performed at [facility], [facility], [place], [place], [place] [address] Lab: [phone] Dir: Dellia Nims. Rubinas, MD       Assessment & Plan:   Problem List Items Addressed This Visit      Cardiovascular and Mediastinum   Hypertension associated with diabetes ([place])    Chronic, ongoing with BP at goal in office and at home.  Urine ALB 80 and A:C 30-300 today -- unable to take ACE or ARB due to history of cough.  Continue current regimen and adjust as needed, no current ACR or ARB due to past cough with Losartan.  CMP today. Return in 3 months.        Relevant Orders   Bayer DCA Hb A1c Waived   Microalbumin, Urine Waived   Comprehensive metabolic panel     Endocrine    Hyperlipidemia associated with type 2 diabetes mellitus ([place])   Relevant Orders   Bayer DCA Hb A1c Waived   Lipid Panel w/o Chol/HDL Ratio   Type 2 diabetes mellitus with proteinuria (HCC) - Primary    Chronic, ongoing.  A1C 7.1% today, slight trend upwards due to poor diet choices.  Urine ALB 80 and A:C 30-300 today -- unable to take ACE or ARB due to history of cough.  Continue current regimen and adjust as needed, no current ACR or ARB due to past cough with Losartan.  CMP today. Continue checking BS at least fasting once weekly and document.  Diabetic diet focus, discussed with patient if ongoing trend upwards next visit will need to increase Metformin.  Return in 3 months.      Relevant Orders   Bayer DCA Hb A1c Waived   Microalbumin, Urine Waived   Type 2 diabetes mellitus with peripheral neuropathy (HCC)    Chronic, ongoing.  A1C 7.1% today, slight trend upwards.  Continue Metformin 500 MG BID, but she is aware if ongoing poor diet choices and elevation A1c next visit will need to increase Metformin.  Continue checking BS at least fasting once weekly and document. Will continue Gabapentin 600 MG prior to bed for neuropathy discomfort at work.  Return in 3 months.        Other   Obesity (BMI 30-39.9)    BMI 32.81.  Recommended eating smaller high protein, low fat meals more frequently and exercising 30 mins a day 5 times a week with a goal of 10-15lb weight loss in the next 3 months. Patient voiced their understanding and motivation to adhere to these recommendations.          Follow up plan: Return in about 3 months (around 01/09/2021) for T2DM, HTN/HLD.

## 2020-10-10 LAB — LIPID PANEL W/O CHOL/HDL RATIO
Cholesterol, Total: 115 mg/dL (ref 100–199)
HDL: 30 mg/dL — ABNORMAL LOW (ref >39)
LDL Chol Calc (NIH): 58 mg/dL (ref 0–99)
Triglycerides: 158 mg/dL — ABNORMAL HIGH (ref 0–149)
VLDL Cholesterol Cal: 27 mg/dL (ref 5–40)

## 2020-10-10 LAB — COMPREHENSIVE METABOLIC PANEL
ALT: 28 IU/L (ref 0–32)
AST: 19 IU/L (ref 0–40)
Albumin/Globulin Ratio: 1.8 (ref 1.2–2.2)
Albumin: 4.4 g/dL (ref 3.8–4.8)
Alkaline Phosphatase: 92 IU/L (ref 44–121)
BUN/Creatinine Ratio: 17 (ref 12–28)
BUN: 11 mg/dL (ref 8–27)
Bilirubin Total: <0.2 mg/dL (ref 0.0–1.2)
CO2: 25 mmol/L (ref 20–29)
Calcium: 10.1 mg/dL (ref 8.7–10.3)
Chloride: 100 mmol/L (ref 96–106)
Creatinine, Ser: 0.66 mg/dL (ref 0.57–1.00)
Globulin, Total: 2.5 g/dL (ref 1.5–4.5)
Glucose: 142 mg/dL — ABNORMAL HIGH (ref 65–99)
Potassium: 3.7 mmol/L (ref 3.5–5.2)
Sodium: 141 mmol/L (ref 134–144)
Total Protein: 6.9 g/dL (ref 6.0–8.5)
eGFR: 100 mL/min/{1.73_m2} (ref >59)

## 2020-10-10 NOTE — Progress Notes (Signed)
Good afternoon, please let Joy know her labs have returned and kidney and liver function are normal.  Cholesterol levels show LDL at goal.  Continue all current medications.  Will see you in 3 months.  Have a great day!! Keep being awesome!!  Thank you for allowing me to participate in your care. Kindest regards, Nayda Riesen

## 2020-11-12 ENCOUNTER — Telehealth: Payer: Self-pay | Admitting: General Practice

## 2020-11-12 ENCOUNTER — Telehealth: Payer: Self-pay

## 2020-11-12 NOTE — Telephone Encounter (Signed)
  Chronic Care Management   Outreach Note  11/12/2020 Name: Julia Wood MRN: [id number] DOB: Jul 28, 1961  Referred by: Venita Lick, NP Reason for referral : Appointment (RNCM: Follow up for Chronic Disease Management and Care Coordination Needs )   An unsuccessful telephone outreach was attempted today. The patient was referred to the case management team for assistance with care management and care coordination.   Follow Up Plan: The care management team will reach out to the patient again over the next 30 days. The patient answered but was asleep, ask for a call back at another time. Will reschedule.   Noreene Larsson RN, MSN, [facility] Mobile: [phone]

## 2020-11-29 ENCOUNTER — Other Ambulatory Visit: Payer: Self-pay | Admitting: Nurse Practitioner

## 2020-12-02 ENCOUNTER — Other Ambulatory Visit: Payer: Self-pay | Admitting: Nurse Practitioner

## 2020-12-20 ENCOUNTER — Telehealth: Payer: Self-pay

## 2020-12-20 ENCOUNTER — Telehealth: Payer: Self-pay | Admitting: General Practice

## 2020-12-20 NOTE — Telephone Encounter (Signed)
  Care Management   Follow Up Note   12/20/2020 Name: Julia Wood MRN: [id number] DOB: 30-Jul-1961   Referred by: Venita Lick, NP Reason for referral : Care Coordination (RNCM: Follow up for Chronic Disease Management and Care Coordination Needs- 2nd attempt)   A second unsuccessful telephone outreach was attempted today. The patient was referred to the case management team for assistance with care management and care coordination.   Follow Up Plan: A HIPPA compliant phone message was left for the patient providing contact information and requesting a return call.   Noreene Larsson RN, MSN, [facility] Mobile: [phone]

## 2021-01-09 ENCOUNTER — Other Ambulatory Visit: Payer: Self-pay

## 2021-01-09 ENCOUNTER — Encounter: Payer: Self-pay | Admitting: Nurse Practitioner

## 2021-01-09 ENCOUNTER — Ambulatory Visit (INDEPENDENT_AMBULATORY_CARE_PROVIDER_SITE_OTHER): Payer: 59 | Admitting: Nurse Practitioner

## 2021-01-09 VITALS — BP 122/78 | HR 71 | Temp 98.1°F | Wt 180.0 lb

## 2021-01-09 DIAGNOSIS — E1169 Type 2 diabetes mellitus with other specified complication: Secondary | ICD-10-CM | POA: Diagnosis not present

## 2021-01-09 DIAGNOSIS — E1142 Type 2 diabetes mellitus with diabetic polyneuropathy: Secondary | ICD-10-CM

## 2021-01-09 DIAGNOSIS — E1129 Type 2 diabetes mellitus with other diabetic kidney complication: Secondary | ICD-10-CM | POA: Diagnosis not present

## 2021-01-09 DIAGNOSIS — E785 Hyperlipidemia, unspecified: Secondary | ICD-10-CM

## 2021-01-09 DIAGNOSIS — E1159 Type 2 diabetes mellitus with other circulatory complications: Secondary | ICD-10-CM

## 2021-01-09 DIAGNOSIS — E669 Obesity, unspecified: Secondary | ICD-10-CM

## 2021-01-09 DIAGNOSIS — R809 Proteinuria, unspecified: Secondary | ICD-10-CM

## 2021-01-09 DIAGNOSIS — I152 Hypertension secondary to endocrine disorders: Secondary | ICD-10-CM

## 2021-01-09 LAB — BAYER DCA HB A1C WAIVED: HB A1C (BAYER DCA - WAIVED): 6.6 % (ref <7.0)

## 2021-01-09 NOTE — Assessment & Plan Note (Signed)
Chronic, ongoing with BP at goal in office on recheck and at home.  Urine ALB 80 and A:C 30-300 March 2022 -- unable to take ACE or ARB due to history of cough.  Continue current regimen and adjust as needed, no current ACR or ARB due to past cough with Losartan.  CMP next visit. Return in 3 months.

## 2021-01-09 NOTE — Assessment & Plan Note (Signed)
Chronic, ongoing.  A1C 6.6% today, slight trend downwards -- praised for this.  Remains well-controlled with Metformin 500 MG BID, continue this at this time -- discussed with her possibly adding injectable like Ozempic to help with weight loss, but she refuses this.  Continue checking BS at least fasting once weekly and document. Will continue Gabapentin 600 MG prior to bed for neuropathy discomfort at work.  Return in 3 months.

## 2021-01-09 NOTE — Patient Instructions (Signed)
Semaglutide injection solution What is this medicine? SEMAGLUTIDE (Sem a GLOO tide) is used to improve blood sugar control in adults with type 2 diabetes. This medicine may be used with other diabetes medicines. This drug may also reduce the risk of heart attack or stroke if you have type 2 diabetes and risk factors for heart disease. This medicine may be used for other purposes; ask your health care provider or pharmacist if you have questions. COMMON BRAND NAME(S): OZEMPIC What should I tell my health care provider before I take this medicine? They need to know if you have any of these conditions: endocrine tumors (MEN 2) or if someone in your family had these tumors eye disease, vision problems history of pancreatitis kidney disease stomach problems thyroid cancer or if someone in your family had thyroid cancer an unusual or allergic reaction to semaglutide, other medicines, foods, dyes, or preservatives pregnant or trying to get pregnant breast-feeding How should I use this medicine? This medicine is for injection under the skin of your upper leg (thigh), stomach area, or upper arm. It is given once every week (every 7 days). You will be taught how to prepare and give this medicine. Use exactly as directed. Take your medicine at regular intervals. Do not take it more often than directed. If you use this medicine with insulin, you should inject this medicine and the insulin separately. Do not mix them together. Do not give the injections right next to each other. Change (rotate) injection sites with each injection. It is important that you put your used needles and syringes in a special sharps container. Do not put them in a trash can. If you do not have a sharps container, call your pharmacist or healthcare provider to get one. A special MedGuide will be given to you by the pharmacist with each prescription and refill. Be sure to read this information carefully each time. This drug comes  with INSTRUCTIONS FOR USE. Ask your pharmacist for directions on how to use this drug. Read the information carefully. Talk to your pharmacist or health care provider if you have questions. Talk to your pediatrician regarding the use of this medicine in children. Special care may be needed. Overdosage: If you think you have taken too much of this medicine contact a poison control center or emergency room at once. NOTE: This medicine is only for you. Do not share this medicine with others. What if I miss a dose? If you miss a dose, take it as soon as you can within 5 days after the missed dose. Then take your next dose at your regular weekly time. If it has been longer than 5 days after the missed dose, do not take the missed dose. Take the next dose at your regular time. Do not take double or extra doses. If you have questions about a missed dose, contact your health care provider for advice. What may interact with this medicine? other medicines for diabetes Many medications may cause changes in blood sugar, these include: alcohol containing beverages antiviral medicines for HIV or AIDS aspirin and aspirin-like drugs certain medicines for blood pressure, heart disease, irregular heart beat chromium diuretics female hormones, such as estrogens or progestins, birth control pills fenofibrate gemfibrozil isoniazid lanreotide female hormones or anabolic steroids MAOIs like Carbex, Eldepryl, Marplan, Nardil, and Parnate medicines for weight loss medicines for allergies, asthma, cold, or cough medicines for depression, anxiety, or psychotic disturbances niacin nicotine NSAIDs, medicines for pain and inflammation, like ibuprofen or naproxen octreotide  pasireotide pentamidine phenytoin probenecid quinolone antibiotics such as ciprofloxacin, levofloxacin, ofloxacin some herbal dietary supplements steroid medicines such as prednisone or cortisone sulfamethoxazole; trimethoprim thyroid  hormones Some medications can hide the warning symptoms of low blood sugar (hypoglycemia). You may need to monitor your blood sugar more closely if you are taking one of these medications. These include: beta-blockers, often used for high blood pressure or heart problems (examples include atenolol, metoprolol, propranolol) clonidine guanethidine reserpine This list may not describe all possible interactions. Give your health care provider a list of all the medicines, herbs, non-prescription drugs, or dietary supplements you use. Also tell them if you smoke, drink alcohol, or use illegal drugs. Some items may interact with your medicine. What should I watch for while using this medicine? Visit your doctor or health care professional for regular checks on your progress. Drink plenty of fluids while taking this medicine. Check with your doctor or health care professional if you get an attack of severe diarrhea, nausea, and vomiting. The loss of too much body fluid can make it dangerous for you to take this medicine. A test called the HbA1C (A1C) will be monitored. This is a simple blood test. It measures your blood sugar control over the last 2 to 3 months. You will receive this test every 3 to 6 months. Learn how to check your blood sugar. Learn the symptoms of low and high blood sugar and how to manage them. Always carry a quick-source of sugar with you in case you have symptoms of low blood sugar. Examples include hard sugar candy or glucose tablets. Make sure others know that you can choke if you eat or drink when you develop serious symptoms of low blood sugar, such as seizures or unconsciousness. They must get medical help at once. Tell your doctor or health care professional if you have high blood sugar. You might need to change the dose of your medicine. If you are sick or exercising more than usual, you might need to change the dose of your medicine. Do not skip meals. Ask your doctor or health  care professional if you should avoid alcohol. Many nonprescription cough and cold products contain sugar or alcohol. These can affect blood sugar. Pens should never be shared. Even if the needle is changed, sharing may result in passing of viruses like hepatitis or HIV. Wear a medical ID bracelet or chain, and carry a card that describes your disease and details of your medicine and dosage times. Do not become pregnant while taking this medicine. Women should inform their doctor if they wish to become pregnant or think they might be pregnant. There is a potential for serious side effects to an unborn child. Talk to your health care professional or pharmacist for more information. What side effects may I notice from receiving this medicine? Side effects that you should report to your doctor or health care professional as soon as possible: allergic reactions like skin rash, itching or hives, swelling of the face, lips, or tongue breathing problems changes in vision diarrhea that continues or is severe lump or swelling on the neck severe nausea signs and symptoms of infection like fever or chills; cough; sore throat; pain or trouble passing urine signs and symptoms of low blood sugar such as feeling anxious, confusion, dizziness, increased hunger, unusually weak or tired, sweating, shakiness, cold, irritable, headache, blurred vision, fast heartbeat, loss of consciousness signs and symptoms of kidney injury like trouble passing urine or change in the amount of urine  trouble swallowing unusual stomach upset or pain vomiting Side effects that usually do not require medical attention (report to your doctor or health care professional if they continue or are bothersome): constipation diarrhea nausea pain, redness, or irritation at site where injected stomach upset This list may not describe all possible side effects. Call your doctor for medical advice about side effects. You may report side  effects to FDA at 1-800-FDA-1088. Where should I keep my medicine? Keep out of the reach of children. Store unopened pens in a refrigerator between 2 and 8 degrees C (36 and 46 degrees F). Do not freeze. Protect from light and heat. After you first use the pen, it can be stored for 56 days at room temperature between 15 and 30 degrees C (59 and 86 degrees F) or in a refrigerator. Throw away your used pen after 56 days or after the expiration date, whichever comes first. Do not store your pen with the needle attached. If the needle is left on, medicine may leak from the pen. NOTE: This sheet is a summary. It may not cover all possible information. If you have questions about this medicine, talk to your doctor, pharmacist, or health care provider.  2021 Elsevier/Gold Standard (2019-04-04 09:41:51)

## 2021-01-09 NOTE — Assessment & Plan Note (Signed)
BMI 31.89, lost 7 pounds since January.  Recommended eating smaller high protein, low fat meals more frequently and exercising 30 mins a day 5 times a week with a goal of 10-15lb weight loss in the next 3 months. Patient voiced their understanding and motivation to adhere to these recommendations.

## 2021-01-09 NOTE — Progress Notes (Signed)
BP 122/78 (BP Location: Left Arm)   Pulse 71   Temp 98.1 F (36.7 C) (Oral)   Wt 180 lb (81.6 kg)   LMP  (LMP Unknown)   SpO2 99%   BMI 31.89 kg/m    Subjective:    Patient ID: Julia Wood, female    DOB: [birth year], 60 y.o.   MRN: [id number]  HPI: Julia Wood is a 60 y.o. female  Chief Complaint  Patient presents with   Diabetes   Hyperlipidemia   Hypertension    DIABETES Last A1C 7.1% March, initially placed on Metformin in February 2020 with A1C 7.2%.  Taking Metformin one tablet twice a day and Gabapentin 600 MG at bedtime for neuropathy pain.  Does endorse drinking some sodas and eating more sweets. Hypoglycemic episodes:no Polydipsia/polyuria: no Visual disturbance: no Chest pain: no Paresthesias: no Glucose Monitoring: yes  Accucheck frequency: rarely  Fasting glucose: 130 range  Post prandial:  Evening:  Before meals: Taking Insulin?: no  Long acting insulin:  Short acting insulin: Blood Pressure Monitoring: a few times a week Retinal Examination: Not up to Date -- [place] Exam: Up to Date Pneumovax: Not up to Date Influenza: Not Up to Date Aspirin: yes   HYPERTENSION / HYPERLIPIDEMIA Continues on HCTZ, Amlodipine, and Crestor. Satisfied with current treatment? yes Duration of hypertension: chronic BP monitoring frequency: a few times a week BP range: on average 130/70 range BP medication side effects: no Duration of hyperlipidemia: chronic Cholesterol medication side effects: no Cholesterol supplements: none Medication compliance: good compliance Aspirin: yes Recent stressors: no Recurrent headaches: no Visual changes: no Palpitations: no Dyspnea: no Chest pain: no Lower extremity edema: no Dizzy/lightheaded: no    Depression screen [facility] 2/9 01/09/2021 10/09/2020 09/22/2019 08/18/2018  Decreased Interest 0 0 0 0  Down, Depressed, Hopeless 0 0 0 0  PHQ - 2 Score 0 0 0 0  Altered sleeping 0 0 - 1  Tired, decreased energy 0 0  - 0  Change in appetite 0 0 - 1  Feeling bad or failure about yourself  0 0 - 0  Trouble concentrating 0 0 - 0  Moving slowly or fidgety/restless 0 0 - 0  Suicidal thoughts 0 0 - 0  PHQ-9 Score 0 0 - 2  Difficult doing work/chores - - - Not difficult at all   Relevant past medical, surgical, family and social history reviewed and updated as indicated. Interim medical history since our last visit reviewed. Allergies and medications reviewed and updated.  Review of Systems  Constitutional:  Negative for activity change, appetite change, diaphoresis, fatigue and fever.  Respiratory:  Negative for cough, chest tightness and shortness of breath.   Cardiovascular:  Negative for chest pain, palpitations and leg swelling.  Gastrointestinal: Negative.   Endocrine: Negative for cold intolerance, heat intolerance, polydipsia, polyphagia and polyuria.  Neurological: Negative.   Psychiatric/Behavioral: Negative.     Per HPI unless specifically indicated above     Objective:    BP 122/78 (BP Location: Left Arm)   Pulse 71   Temp 98.1 F (36.7 C) (Oral)   Wt 180 lb (81.6 kg)   LMP  (LMP Unknown)   SpO2 99%   BMI 31.89 kg/m   Wt Readings from Last 3 Encounters:  01/09/21 180 lb (81.6 kg)  10/09/20 185 lb 3.2 oz (84 kg)  08/22/20 187 lb (84.8 kg)    Physical Exam Vitals and nursing note reviewed.  Constitutional:  General: She is awake. She is not in acute distress.    Appearance: She is well-developed and well-groomed. She is obese. She is not ill-appearing.  HENT:     Head: Normocephalic.     Right Ear: Hearing normal.     Left Ear: Hearing normal.  Eyes:     General: Lids are normal.        Right eye: No discharge.        Left eye: No discharge.     Conjunctiva/sclera: Conjunctivae normal.     Pupils: Pupils are equal, round, and reactive to light.  Neck:     Thyroid: No thyromegaly.     Vascular: No carotid bruit.  Cardiovascular:     Rate and Rhythm: Normal rate and  regular rhythm.     Heart sounds: Normal heart sounds. No murmur heard.   No gallop.  Pulmonary:     Effort: Pulmonary effort is normal. No accessory muscle usage or respiratory distress.     Breath sounds: Normal breath sounds.  Abdominal:     General: Bowel sounds are normal.     Palpations: Abdomen is soft.  Musculoskeletal:     Cervical back: Normal range of motion and neck supple.     Right lower leg: No edema.     Left lower leg: No edema.  Skin:    General: Skin is warm and dry.  Neurological:     Mental Status: She is alert and oriented to person, place, and time.  Psychiatric:        Attention and Perception: Attention normal.        Mood and Affect: Mood normal.        Behavior: Behavior normal. Behavior is cooperative.        Thought Content: Thought content normal.        Judgment: Judgment normal.    Results for orders placed or performed in visit on 10/09/20  Bayer DCA Hb A1c Waived  Result Value Ref Range   HB A1C (BAYER DCA - WAIVED) 7.1 (H) <7.0 %  Microalbumin, Urine Waived  Result Value Ref Range   Microalb, Ur Waived 80 (H) 0 - 19 mg/L   Creatinine, Urine Waived 200 10 - 300 mg/dL   Microalb/Creat Ratio 30-300 (H) <30 mg/g  Comprehensive metabolic panel  Result Value Ref Range   Glucose 142 (H) 65 - 99 mg/dL   BUN 11 8 - 27 mg/dL   Creatinine, Ser 0.66 0.57 - 1.00 mg/dL   eGFR 100 >59 mL/min/1.73   BUN/Creatinine Ratio 17 12 - 28   Sodium 141 134 - 144 mmol/L   Potassium 3.7 3.5 - 5.2 mmol/L   Chloride 100 96 - 106 mmol/L   CO2 25 20 - 29 mmol/L   Calcium 10.1 8.7 - 10.3 mg/dL   Total Protein 6.9 6.0 - 8.5 g/dL   Albumin 4.4 3.8 - 4.8 g/dL   Globulin, Total 2.5 1.5 - 4.5 g/dL   Albumin/Globulin Ratio 1.8 1.2 - 2.2   Bilirubin Total <0.2 0.0 - 1.2 mg/dL   Alkaline Phosphatase 92 44 - 121 IU/L   AST 19 0 - 40 IU/L   ALT 28 0 - 32 IU/L  Lipid Panel w/o Chol/HDL Ratio  Result Value Ref Range   Cholesterol, Total 115 100 - 199 mg/dL    Triglycerides 158 (H) 0 - 149 mg/dL   HDL 30 (L) >39 mg/dL   VLDL Cholesterol Cal 27 5 - 40 mg/dL   LDL  Chol Calc (NIH) 58 0 - 99 mg/dL      Assessment & Plan:   Problem List Items Addressed This Visit       Cardiovascular and Mediastinum   Hypertension associated with diabetes ([place])    Chronic, ongoing with BP at goal in office on recheck and at home.  Urine ALB 80 and A:C 30-300 March 2022 -- unable to take ACE or ARB due to history of cough.  Continue current regimen and adjust as needed, no current ACR or ARB due to past cough with Losartan.  CMP next visit. Return in 3 months.         Relevant Orders   Bayer DCA Hb A1c Waived     Endocrine   Hyperlipidemia associated with type 2 diabetes mellitus (HCC)    Chronic, ongoing.  Continue current medication regimen and adjust as needed.  LDL 58 and TCHOL 115 recent visit, at goal.  Lipid panel next visit.       Relevant Orders   Bayer DCA Hb A1c Waived   Type 2 diabetes mellitus with proteinuria (HCC) - Primary    Chronic, ongoing.  A1C 6.6% today, slight trend downwards, praised for this.  Urine ALB 80 and A:C 30-300 March 2022 -- unable to take ACE or ARB due to history of cough.  Remains well-controlled with Metformin 500 MG BID, continue this at this time -- discussed with her possibly adding injectable like Ozempic to help with weight loss, but she refuses this.  CMP next visit. Continue checking BS at least fasting once weekly and document.  Diabetic diet focus, discussed with patient if trend upwards next visit will need to increase Metformin.  Return in 3 months.       Relevant Orders   Bayer DCA Hb A1c Waived   Type 2 diabetes mellitus with peripheral neuropathy (HCC)    Chronic, ongoing.  A1C 6.6% today, slight trend downwards -- praised for this.  Remains well-controlled with Metformin 500 MG BID, continue this at this time -- discussed with her possibly adding injectable like Ozempic to help with weight loss, but she  refuses this.  Continue checking BS at least fasting once weekly and document. Will continue Gabapentin 600 MG prior to bed for neuropathy discomfort at work.  Return in 3 months.         Other   Obesity (BMI 30-39.9)    BMI 31.89, lost 7 pounds since January.  Recommended eating smaller high protein, low fat meals more frequently and exercising 30 mins a day 5 times a week with a goal of 10-15lb weight loss in the next 3 months. Patient voiced their understanding and motivation to adhere to these recommendations.         Follow up plan: Return in about 3 months (around 04/11/2021) for T2DM, HTN/HLD.

## 2021-01-09 NOTE — Assessment & Plan Note (Addendum)
Chronic, ongoing.  A1C 6.6% today, slight trend downwards, praised for this.  Urine ALB 80 and A:C 30-300 March 2022 -- unable to take ACE or ARB due to history of cough.  Remains well-controlled with Metformin 500 MG BID, continue this at this time -- discussed with her possibly adding injectable like Ozempic to help with weight loss, but she refuses this.  CMP next visit. Continue checking BS at least fasting once weekly and document.  Diabetic diet focus, discussed with patient if trend upwards next visit will need to increase Metformin.  Return in 3 months.

## 2021-01-09 NOTE — Assessment & Plan Note (Signed)
Chronic, ongoing.  Continue current medication regimen and adjust as needed.  LDL 58 and TCHOL 115 recent visit, at goal.  Lipid panel next visit.

## 2021-02-14 ENCOUNTER — Telehealth: Payer: 59

## 2021-02-14 ENCOUNTER — Telehealth: Payer: Self-pay | Admitting: General Practice

## 2021-02-14 NOTE — Telephone Encounter (Signed)
  Care Management   Follow Up Note   02/14/2021 Name: Julia Wood MRN: [id number] DOB: [birth year]   Referred by: Venita Lick, NP Reason for referral : Care Coordination (RNCM: Follow up with patient for Chronic Disease Management and Care Coordination Needs.- 3rd attempt)   Third unsuccessful telephone outreach was attempted today. The patient was referred to the case management team for assistance with care management and care coordination. The patient's primary care provider has been notified of our unsuccessful attempts to make or maintain contact with the patient. The care management team is pleased to engage with this patient at any time in the future should he/she be interested in assistance from the care management team.   Follow Up Plan: We have been unable to make contact with the patient for follow up. The care management team is available to follow up with the patient after provider conversation with the patient regarding recommendation for care management engagement and subsequent re-referral to the care management team.   Noreene Larsson RN, MSN, [facility] Mobile: [phone]

## 2021-03-09 ENCOUNTER — Other Ambulatory Visit: Payer: Self-pay | Admitting: Nurse Practitioner

## 2021-03-09 NOTE — Telephone Encounter (Signed)
Requested Prescriptions  Pending Prescriptions Disp Refills  . hydrochlorothiazide (HYDRODIURIL) 25 MG tablet [Pharmacy Med Name: hydroCHLOROthiazide 25 MG Oral Tablet] 90 tablet 1    Sig: Take 1 tablet by mouth once daily     Cardiovascular: Diuretics - Thiazide Passed - 03/09/2021  9:46 AM      Passed - Ca in normal range and within 360 days    Calcium  Date Value Ref Range Status  10/09/2020 10.1 8.7 - 10.3 mg/dL Final         Passed - Cr in normal range and within 360 days    Creatinine, Ser  Date Value Ref Range Status  10/09/2020 0.66 0.57 - 1.00 mg/dL Final         Passed - K in normal range and within 360 days    Potassium  Date Value Ref Range Status  10/09/2020 3.7 3.5 - 5.2 mmol/L Final         Passed - Na in normal range and within 360 days    Sodium  Date Value Ref Range Status  10/09/2020 141 134 - 144 mmol/L Final         Passed - Last BP in normal range    BP Readings from Last 1 Encounters:  01/09/21 122/78         Passed - Valid encounter within last 6 months    Recent Outpatient Visits          1 month ago Type 2 diabetes mellitus with proteinuria ([place])   [place], Jolene T, NP   5 months ago Type 2 diabetes mellitus with proteinuria ([place])   [place], Jolene T, NP   8 months ago Type 2 diabetes mellitus with proteinuria ([place])   [place], Jolene T, NP   11 months ago Type 2 diabetes mellitus with proteinuria ([place])   [place], Jolene T, NP   1 year ago Type 2 diabetes mellitus with proteinuria ([place])   [place], Barbaraann Faster, NP      Future Appointments            In 1 month [place], Barbaraann Faster, NP [employer]

## 2021-04-11 ENCOUNTER — Ambulatory Visit: Payer: 59 | Admitting: Nurse Practitioner

## 2021-04-16 ENCOUNTER — Encounter: Payer: Self-pay | Admitting: Nurse Practitioner

## 2021-04-16 ENCOUNTER — Ambulatory Visit (INDEPENDENT_AMBULATORY_CARE_PROVIDER_SITE_OTHER): Payer: 59 | Admitting: Nurse Practitioner

## 2021-04-16 ENCOUNTER — Other Ambulatory Visit: Payer: Self-pay

## 2021-04-16 VITALS — BP 131/73 | HR 76 | Temp 99.7°F | Wt 180.8 lb

## 2021-04-16 DIAGNOSIS — E1142 Type 2 diabetes mellitus with diabetic polyneuropathy: Secondary | ICD-10-CM

## 2021-04-16 DIAGNOSIS — E785 Hyperlipidemia, unspecified: Secondary | ICD-10-CM

## 2021-04-16 DIAGNOSIS — E1129 Type 2 diabetes mellitus with other diabetic kidney complication: Secondary | ICD-10-CM | POA: Diagnosis not present

## 2021-04-16 DIAGNOSIS — E1159 Type 2 diabetes mellitus with other circulatory complications: Secondary | ICD-10-CM

## 2021-04-16 DIAGNOSIS — E669 Obesity, unspecified: Secondary | ICD-10-CM

## 2021-04-16 DIAGNOSIS — Z1231 Encounter for screening mammogram for malignant neoplasm of breast: Secondary | ICD-10-CM

## 2021-04-16 DIAGNOSIS — E1169 Type 2 diabetes mellitus with other specified complication: Secondary | ICD-10-CM | POA: Diagnosis not present

## 2021-04-16 DIAGNOSIS — R809 Proteinuria, unspecified: Secondary | ICD-10-CM

## 2021-04-16 DIAGNOSIS — I152 Hypertension secondary to endocrine disorders: Secondary | ICD-10-CM

## 2021-04-16 LAB — BAYER DCA HB A1C WAIVED: HB A1C (BAYER DCA - WAIVED): 7.3 % — ABNORMAL HIGH (ref 4.8–5.6)

## 2021-04-16 MED ORDER — ROSUVASTATIN CALCIUM 10 MG PO TABS: 10.0000 mg | ORAL_TABLET | Freq: Every day | ORAL | 4 refills | Status: DC

## 2021-04-16 MED ORDER — HYDROCHLOROTHIAZIDE 25 MG PO TABS: 25.0000 mg | ORAL_TABLET | Freq: Every day | ORAL | 4 refills | Status: DC

## 2021-04-16 MED ORDER — GABAPENTIN 300 MG PO CAPS: 600.0000 mg | ORAL_CAPSULE | Freq: Every day | ORAL | 4 refills | Status: DC

## 2021-04-16 MED ORDER — METFORMIN HCL 500 MG PO TABS: 500.0000 mg | ORAL_TABLET | Freq: Two times a day (BID) | ORAL | 4 refills | Status: DC

## 2021-04-16 MED ORDER — AMLODIPINE BESYLATE 5 MG PO TABS: 5.0000 mg | ORAL_TABLET | Freq: Every day | ORAL | 4 refills | Status: DC

## 2021-04-16 NOTE — Assessment & Plan Note (Signed)
Chronic, ongoing with BP at goal in office today and at home.  Urine ALB 80 and A:C 30-300 March 2022 -- unable to take ACE or ARB due to history of cough.  Continue current regimen and adjust as needed.  Recommend she monitor BP at least a few mornings a week at home and document.  DASH diet at home.  LABS: CMP and TSH.  Refills sent.  Return in 3 months.

## 2021-04-16 NOTE — Progress Notes (Signed)
BP 131/73   Pulse 76   Temp 99.7 F (37.6 C) (Oral)   Wt 180 lb 12.8 oz (82 kg)   LMP  (LMP Unknown)   SpO2 97%   BMI 32.03 kg/m    Subjective:    Patient ID: Julia Wood, female    DOB: 08/10/1960, 60 y.o.   MRN: [id number]  HPI: Julia Wood is a 60 y.o. female  Chief Complaint  Patient presents with   Diabetes    Patient states she has been having some low readings for her sugar reading and for blood pressure readings. Patient states her readings have been ranging around 100 in the mornings before she eats. Patent states she has not had an Diabetic Eye Exam as she has not had insurance and she is going to reach out this evening to get a quote for self pay for an appointment.    Hyperlipidemia   Hypertension    DIABETES Last A1c was 6.6% in June.  Initially placed on Metformin in February 2020 with A1C 7.2%.  Taking Metformin one tablet twice a day (occasionally misses her night dose) and Gabapentin 600 MG at bedtime for neuropathy pain.  Does endorse eating more sweets and food lately. Hypoglycemic episodes:no Polydipsia/polyuria: no Visual disturbance: no Chest pain: no Paresthesias: no Glucose Monitoring: yes  Accucheck frequency: rarely  Fasting glucose: 100 to 106  Post prandial:  Evening:  Before meals: Taking Insulin?: no  Long acting insulin:  Short acting insulin: Blood Pressure Monitoring: a few times a week Retinal Examination: Not up to Date -- [place] Exam: Up to Date Pneumovax: Not up to Date Influenza: Not Up to Date Aspirin: yes   HYPERTENSION / HYPERLIPIDEMIA Continues on HCTZ, Amlodipine, and Crestor. Satisfied with current treatment? yes Duration of hypertension: chronic BP monitoring frequency: a few times a week BP range: on average 130/70 range BP medication side effects: no Duration of hyperlipidemia: chronic Cholesterol medication side effects: no Cholesterol supplements: none Medication compliance: good  compliance Aspirin: yes Recent stressors: no Recurrent headaches: no Visual changes: no Palpitations: no Dyspnea: no Chest pain: no Lower extremity edema: no Dizzy/lightheaded: no   Relevant past medical, surgical, family and social history reviewed and updated as indicated. Interim medical history since our last visit reviewed. Allergies and medications reviewed and updated.  Review of Systems  Constitutional:  Negative for activity change, appetite change, diaphoresis, fatigue and fever.  Respiratory:  Negative for cough, chest tightness and shortness of breath.   Cardiovascular:  Negative for chest pain, palpitations and leg swelling.  Gastrointestinal: Negative.   Endocrine: Negative for cold intolerance, heat intolerance, polydipsia, polyphagia and polyuria.  Neurological: Negative.   Psychiatric/Behavioral: Negative.     Per HPI unless specifically indicated above     Objective:    BP 131/73   Pulse 76   Temp 99.7 F (37.6 C) (Oral)   Wt 180 lb 12.8 oz (82 kg)   LMP  (LMP Unknown)   SpO2 97%   BMI 32.03 kg/m   Wt Readings from Last 3 Encounters:  04/16/21 180 lb 12.8 oz (82 kg)  01/09/21 180 lb (81.6 kg)  10/09/20 185 lb 3.2 oz (84 kg)    Physical Exam Vitals and nursing note reviewed.  Constitutional:      General: She is awake. She is not in acute distress.    Appearance: She is well-developed and well-groomed. She is obese. She is not ill-appearing.  HENT:     Head:  Normocephalic.     Right Ear: Hearing normal.     Left Ear: Hearing normal.  Eyes:     General: Lids are normal.        Right eye: No discharge.        Left eye: No discharge.     Conjunctiva/sclera: Conjunctivae normal.     Pupils: Pupils are equal, round, and reactive to light.  Neck:     Thyroid: No thyromegaly.     Vascular: No carotid bruit.  Cardiovascular:     Rate and Rhythm: Normal rate and regular rhythm.     Heart sounds: Normal heart sounds. No murmur heard.   No gallop.   Pulmonary:     Effort: Pulmonary effort is normal. No accessory muscle usage or respiratory distress.     Breath sounds: Normal breath sounds.  Abdominal:     General: Bowel sounds are normal.     Palpations: Abdomen is soft.  Musculoskeletal:     Cervical back: Normal range of motion and neck supple.     Right lower leg: No edema.     Left lower leg: No edema.  Skin:    General: Skin is warm and dry.  Neurological:     Mental Status: She is alert and oriented to person, place, and time.  Psychiatric:        Attention and Perception: Attention normal.        Mood and Affect: Mood normal.        Behavior: Behavior normal. Behavior is cooperative.        Thought Content: Thought content normal.        Judgment: Judgment normal.    Results for orders placed or performed in visit on 04/16/21  Bayer DCA Hb A1c Waived  Result Value Ref Range   HB A1C (BAYER DCA - WAIVED) 7.3 (H) 4.8 - 5.6 %      Assessment & Plan:   Problem List Items Addressed This Visit       Cardiovascular and Mediastinum   Hypertension associated with diabetes ([place])    Chronic, ongoing with BP at goal in office today and at home.  Urine ALB 80 and A:C 30-300 March 2022 -- unable to take ACE or ARB due to history of cough.  Continue current regimen and adjust as needed.  Recommend she monitor BP at least a few mornings a week at home and document.  DASH diet at home.  LABS: CMP and TSH.  Refills sent.  Return in 3 months.       Relevant Medications   amLODipine (NORVASC) 5 MG tablet   hydrochlorothiazide (HYDRODIURIL) 25 MG tablet   metFORMIN (GLUCOPHAGE) 500 MG tablet   rosuvastatin (CRESTOR) 10 MG tablet   Other Relevant Orders   Bayer DCA Hb A1c Waived (Completed)   Comprehensive metabolic panel   TSH     Endocrine   Hyperlipidemia associated with type 2 diabetes mellitus (HCC)    Chronic, ongoing.  Continue current medication regimen and adjust as needed.  LDL 58 and TCHOL 115 recent visit, at  goal.  Lipid panel today.        Relevant Medications   metFORMIN (GLUCOPHAGE) 500 MG tablet   rosuvastatin (CRESTOR) 10 MG tablet   Other Relevant Orders   Bayer DCA Hb A1c Waived (Completed)   Lipid Panel w/o Chol/HDL Ratio   Type 2 diabetes mellitus with proteinuria (HCC)    Chronic, ongoing.  A1C 7.3% today, slight trend upward due  to missing medication doses and poor diet.  Urine ALB 80 and A:C 30-300 March 2022 -- unable to take ACE or ARB due to history of cough.  Continue Metformin 500 MG BID, no missing doses -- discussed with her possibly adding injectable like Ozempic to help with weight loss, but she refuses this.  CMP today. Continue checking BS at least fasting once weekly and document.  Diabetic diet focus, discussed with patient if trend upwards next visit will need to increase Metformin.  Return in 3 months.      Relevant Medications   metFORMIN (GLUCOPHAGE) 500 MG tablet   rosuvastatin (CRESTOR) 10 MG tablet   Other Relevant Orders   Bayer DCA Hb A1c Waived (Completed)   TSH   Type 2 diabetes mellitus with peripheral neuropathy (HCC) - Primary    Chronic, ongoing.  A1C 7.3%, with slight trend upwards.  Continue Metformin 500 MG BID, no missing doses.  Continue checking BS at least fasting once weekly and document. Will continue Gabapentin 600 MG prior to bed for neuropathy discomfort at work.  Return in 3 months.      Relevant Medications   gabapentin (NEURONTIN) 300 MG capsule   metFORMIN (GLUCOPHAGE) 500 MG tablet   rosuvastatin (CRESTOR) 10 MG tablet     Other   Obesity (BMI 30-39.9)    BMI 32.03.  Recommended eating smaller high protein, low fat meals more frequently and exercising 30 mins a day 5 times a week with a goal of 10-15lb weight loss in the next 3 months. Patient voiced their understanding and motivation to adhere to these recommendations.       Relevant Medications   metFORMIN (GLUCOPHAGE) 500 MG tablet   Other Visit Diagnoses     Encounter for  screening mammogram for malignant neoplasm of breast       Mammogram ordered and highly recommend she call and schedule   Relevant Orders   MM 3D SCREEN BREAST BILATERAL       Follow up plan: Return in about 3 months (around 07/16/2021) for T2DM, HTN/HLD.

## 2021-04-16 NOTE — Patient Instructions (Addendum)
[facility] at [facility]  Address: [address] [place], [place], [place] [address]  Phone: [phone]   Diabetes Mellitus and Nutrition, Adult When you have diabetes, or diabetes mellitus, it is very important to have healthy eating habits because your blood sugar (glucose) levels are greatly affected by what you eat and drink. Eating healthy foods in the right amounts, at about the same times every day, can help you: Control your blood glucose. Lower your risk of heart disease. Improve your blood pressure. Reach or maintain a healthy weight. What can affect my meal plan? Every person with diabetes is different, and each person has different needs for a meal plan. Your health care provider may recommend that you work with a dietitian to make a meal plan that is best for you. Your meal plan may vary depending on factors such as: The calories you need. The medicines you take. Your weight. Your blood glucose, blood pressure, and cholesterol levels. Your activity level. Other health conditions you have, such as heart or kidney disease. How do carbohydrates affect me? Carbohydrates, also called carbs, affect your blood glucose level more than any other type of food. Eating carbs naturally raises the amount of glucose in your blood. Carb counting is a method for keeping track of how many carbs you eat. Counting carbs is important to keep your blood glucose at a healthy level, especially if you use insulin or take certain oral diabetes medicines. It is important to know how many carbs you can safely have in each meal. This is different for every person. Your dietitian can help you calculate how many carbs you should have at each meal and for each snack. How does alcohol affect me? Alcohol can cause a sudden decrease in blood glucose (hypoglycemia), especially if you use insulin or take certain oral diabetes medicines. Hypoglycemia can be a life-threatening condition. Symptoms of  hypoglycemia, such as sleepiness, dizziness, and confusion, are similar to symptoms of having too much alcohol. Do not drink alcohol if: Your health care provider tells you not to drink. You are pregnant, may be pregnant, or are planning to become pregnant. If you drink alcohol: Do not drink on an empty stomach. Limit how much you use to: 0-1 drink a day for women. 0-2 drinks a day for men. Be aware of how much alcohol is in your drink. In the [place] one drink equals one 12 oz bottle of beer (355 mL), one 5 oz glass of wine (148 mL), or one 1 oz glass of hard liquor (44 mL). Keep yourself hydrated with water, diet soda, or unsweetened iced tea. Keep in mind that regular soda, juice, and other mixers may contain a lot of sugar and must be counted as carbs. What are tips for following this plan? Reading food labels Start by checking the serving size on the "Nutrition Facts" label of packaged foods and drinks. The amount of calories, carbs, fats, and other nutrients listed on the label is based on one serving of the item. Many items contain more than one serving per package. Check the total grams (g) of carbs in one serving. You can calculate the number of servings of carbs in one serving by dividing the total carbs by 15. For example, if a food has 30 g of total carbs per serving, it would be equal to 2 servings of carbs. Check the number of grams (g) of saturated fats and trans fats in one serving. Choose foods that have a low  amount or none of these fats. Check the number of milligrams (mg) of salt (sodium) in one serving. Most people should limit total sodium intake to less than 2,300 mg per day. Always check the nutrition information of foods labeled as "low-fat" or "nonfat." These foods may be higher in added sugar or refined carbs and should be avoided. Talk to your dietitian to identify your daily goals for nutrients listed on the label. Shopping Avoid buying canned, pre-made, or processed  foods. These foods tend to be high in fat, sodium, and added sugar. Shop around the outside edge of the grocery store. This is where you will most often find fresh fruits and vegetables, bulk grains, fresh meats, and fresh dairy. Cooking Use low-heat cooking methods, such as baking, instead of high-heat cooking methods like deep frying. Cook using healthy oils, such as olive, canola, or sunflower oil. Avoid cooking with butter, cream, or high-fat meats. Meal planning Eat meals and snacks regularly, preferably at the same times every day. Avoid going long periods of time without eating. Eat foods that are high in fiber, such as fresh fruits, vegetables, beans, and whole grains. Talk with your dietitian about how many servings of carbs you can eat at each meal. Eat 4-6 oz (112-168 g) of lean protein each day, such as lean meat, chicken, fish, eggs, or tofu. One ounce (oz) of lean protein is equal to: 1 oz (28 g) of meat, chicken, or fish. 1 egg.  cup (62 g) of tofu. Eat some foods each day that contain healthy fats, such as avocado, nuts, seeds, and fish. What foods should I eat? Fruits Berries. Apples. Oranges. Peaches. Apricots. Plums. Grapes. Mango. Papaya. Pomegranate. Kiwi. Cherries. Vegetables Lettuce. Spinach. Leafy greens, including kale, chard, collard greens, and mustard greens. Beets. Cauliflower. Cabbage. Broccoli. Carrots. Green beans. Tomatoes. Peppers. Onions. Cucumbers. Brussels sprouts. Grains Whole grains, such as whole-wheat or whole-grain bread, crackers, tortillas, cereal, and pasta. Unsweetened oatmeal. Quinoa. Brown or wild rice. Meats and other proteins Seafood. Poultry without skin. Lean cuts of poultry and beef. Tofu. Nuts. Seeds. Dairy Low-fat or fat-free dairy products such as milk, yogurt, and cheese. The items listed above may not be a complete list of foods and beverages you can eat. Contact a dietitian for more information. What foods should I  avoid? Fruits Fruits canned with syrup. Vegetables Canned vegetables. Frozen vegetables with butter or cream sauce. Grains Refined white flour and flour products such as bread, pasta, snack foods, and cereals. Avoid all processed foods. Meats and other proteins Fatty cuts of meat. Poultry with skin. Breaded or fried meats. Processed meat. Avoid saturated fats. Dairy Full-fat yogurt, cheese, or milk. Beverages Sweetened drinks, such as soda or iced tea. The items listed above may not be a complete list of foods and beverages you should avoid. Contact a dietitian for more information. Questions to ask a health care provider Do I need to meet with a diabetes educator? Do I need to meet with a dietitian? What number can I call if I have questions? When are the best times to check my blood glucose? Where to find more information: American Diabetes Association: diabetes.org Academy of Nutrition and Dietetics: www.eatright.[employer] of Diabetes and Digestive and Kidney Diseases: [employer] Association of Diabetes Care and Education Specialists: www.diabeteseducator.org Summary It is important to have healthy eating habits because your blood sugar (glucose) levels are greatly affected by what you eat and drink. A healthy meal plan will help you control your blood glucose and  maintain a healthy lifestyle. Your health care provider may recommend that you work with a dietitian to make a meal plan that is best for you. Keep in mind that carbohydrates (carbs) and alcohol have immediate effects on your blood glucose levels. It is important to count carbs and to use alcohol carefully. This information is not intended to replace advice given to you by your health care provider. Make sure you discuss any questions you have with your health care provider. Document Revised: 06/27/2019 Document Reviewed: 06/27/2019 Elsevier Patient Education  2021 [employer].

## 2021-04-16 NOTE — Assessment & Plan Note (Signed)
Chronic, ongoing.  A1C 7.3%, with slight trend upwards.  Continue Metformin 500 MG BID, no missing doses.  Continue checking BS at least fasting once weekly and document. Will continue Gabapentin 600 MG prior to bed for neuropathy discomfort at work.  Return in 3 months.

## 2021-04-16 NOTE — Assessment & Plan Note (Signed)
Chronic, ongoing.  Continue current medication regimen and adjust as needed.  LDL 58 and TCHOL 115 recent visit, at goal.  Lipid panel today.

## 2021-04-16 NOTE — Assessment & Plan Note (Signed)
Chronic, ongoing.  A1C 7.3% today, slight trend upward due to missing medication doses and poor diet.  Urine ALB 80 and A:C 30-300 March 2022 -- unable to take ACE or ARB due to history of cough.  Continue Metformin 500 MG BID, no missing doses -- discussed with her possibly adding injectable like Ozempic to help with weight loss, but she refuses this.  CMP today. Continue checking BS at least fasting once weekly and document.  Diabetic diet focus, discussed with patient if trend upwards next visit will need to increase Metformin.  Return in 3 months.

## 2021-04-16 NOTE — Assessment & Plan Note (Signed)
BMI 32.03.  Recommended eating smaller high protein, low fat meals more frequently and exercising 30 mins a day 5 times a week with a goal of 10-15lb weight loss in the next 3 months. Patient voiced their understanding and motivation to adhere to these recommendations.  

## 2021-04-17 LAB — COMPREHENSIVE METABOLIC PANEL
ALT: 20 IU/L (ref 0–32)
AST: 21 IU/L (ref 0–40)
Albumin/Globulin Ratio: 2.1 (ref 1.2–2.2)
Albumin: 4.2 g/dL (ref 3.8–4.8)
Alkaline Phosphatase: 76 IU/L (ref 44–121)
BUN/Creatinine Ratio: 10 — ABNORMAL LOW (ref 12–28)
BUN: 9 mg/dL (ref 8–27)
Bilirubin Total: <0.2 mg/dL (ref 0.0–1.2)
CO2: 26 mmol/L (ref 20–29)
Calcium: 9.5 mg/dL (ref 8.7–10.3)
Chloride: 100 mmol/L (ref 96–106)
Creatinine, Ser: 0.86 mg/dL (ref 0.57–1.00)
Globulin, Total: 2 g/dL (ref 1.5–4.5)
Glucose: 125 mg/dL — ABNORMAL HIGH (ref 65–99)
Potassium: 3.6 mmol/L (ref 3.5–5.2)
Sodium: 140 mmol/L (ref 134–144)
Total Protein: 6.2 g/dL (ref 6.0–8.5)
eGFR: 77 mL/min/{1.73_m2} (ref >59)

## 2021-04-17 LAB — LIPID PANEL W/O CHOL/HDL RATIO
Cholesterol, Total: 113 mg/dL (ref 100–199)
HDL: 29 mg/dL — ABNORMAL LOW (ref >39)
LDL Chol Calc (NIH): 48 mg/dL (ref 0–99)
Triglycerides: 228 mg/dL — ABNORMAL HIGH (ref 0–149)
VLDL Cholesterol Cal: 36 mg/dL (ref 5–40)

## 2021-04-17 LAB — TSH: TSH: 4.26 u[IU]/mL (ref 0.450–4.500)

## 2021-04-17 NOTE — Progress Notes (Signed)
Good morning, please let Julia Wood know her labs have returned and overall cholesterol is at goal.  Kidney and liver function are normal.  Thyroid normal.  Overall good labs, continue medications ordered.  Any questions? Keep being awesome!!  Thank you for allowing me to participate in your care.  I appreciate you. Kindest regards, Hanifa Antonetti

## 2021-05-02 ENCOUNTER — Other Ambulatory Visit: Payer: Self-pay

## 2021-05-02 ENCOUNTER — Ambulatory Visit
Admission: RE | Admit: 2021-05-02 | Discharge: 2021-05-02 | Disposition: A | Discharge status: Home / Self Care | Payer: 59 | Source: Ambulatory Visit | Attending: Nurse Practitioner | Admitting: Nurse Practitioner

## 2021-05-02 DIAGNOSIS — Z1231 Encounter for screening mammogram for malignant neoplasm of breast: Secondary | ICD-10-CM | POA: Diagnosis not present

## 2021-05-07 ENCOUNTER — Other Ambulatory Visit: Payer: Self-pay | Admitting: Nurse Practitioner

## 2021-05-07 DIAGNOSIS — R928 Other abnormal and inconclusive findings on diagnostic imaging of breast: Secondary | ICD-10-CM

## 2021-05-07 DIAGNOSIS — N6489 Other specified disorders of breast: Secondary | ICD-10-CM

## 2021-05-19 ENCOUNTER — Other Ambulatory Visit: Payer: Self-pay | Admitting: Nurse Practitioner

## 2021-05-19 NOTE — Telephone Encounter (Signed)
Requested Prescriptions  Pending Prescriptions Disp Refills  . amLODipine (NORVASC) 5 MG tablet [Pharmacy Med Name: amLODIPine Besylate 5 MG Oral Tablet] 90 tablet 1    Sig: Take 1 tablet by mouth once daily     Cardiovascular:  Calcium Channel Blockers Passed - 05/19/2021 12:19 PM      Passed - Last BP in normal range    BP Readings from Last 1 Encounters:  04/16/21 131/73         Passed - Valid encounter within last 6 months    Recent Outpatient Visits          1 month ago Type 2 diabetes mellitus with peripheral neuropathy ([place])   [place], Jolene T, NP   4 months ago Type 2 diabetes mellitus with proteinuria ([place])   [place], Jolene T, NP   7 months ago Type 2 diabetes mellitus with proteinuria ([place])   [place], Jolene T, NP   10 months ago Type 2 diabetes mellitus with proteinuria ([place])   [place], Jolene T, NP   1 year ago Type 2 diabetes mellitus with proteinuria ([place])   [place], Barbaraann Faster, NP      Future Appointments            In 1 month [place], Barbaraann Faster, NP [facility]

## 2021-06-02 ENCOUNTER — Other Ambulatory Visit: Payer: Self-pay

## 2021-06-02 ENCOUNTER — Ambulatory Visit
Admission: RE | Admit: 2021-06-02 | Discharge: 2021-06-02 | Disposition: A | Discharge status: Home / Self Care | Payer: 59 | Source: Ambulatory Visit | Attending: Nurse Practitioner | Admitting: Nurse Practitioner

## 2021-06-02 DIAGNOSIS — N6489 Other specified disorders of breast: Secondary | ICD-10-CM

## 2021-06-02 DIAGNOSIS — R928 Other abnormal and inconclusive findings on diagnostic imaging of breast: Secondary | ICD-10-CM | POA: Diagnosis not present

## 2021-06-02 NOTE — Progress Notes (Signed)
Please let Reve know her mammogram is normal and may repeat in one year.

## 2021-07-16 ENCOUNTER — Ambulatory Visit (INDEPENDENT_AMBULATORY_CARE_PROVIDER_SITE_OTHER): Payer: 59 | Admitting: Nurse Practitioner

## 2021-07-16 ENCOUNTER — Other Ambulatory Visit: Payer: Self-pay

## 2021-07-16 ENCOUNTER — Encounter: Payer: Self-pay | Admitting: Nurse Practitioner

## 2021-07-16 VITALS — BP 134/80 | HR 83 | Temp 99.1°F | Wt 175.2 lb

## 2021-07-16 DIAGNOSIS — E1129 Type 2 diabetes mellitus with other diabetic kidney complication: Secondary | ICD-10-CM

## 2021-07-16 DIAGNOSIS — Z23 Encounter for immunization: Secondary | ICD-10-CM

## 2021-07-16 DIAGNOSIS — E669 Obesity, unspecified: Secondary | ICD-10-CM

## 2021-07-16 DIAGNOSIS — E1159 Type 2 diabetes mellitus with other circulatory complications: Secondary | ICD-10-CM | POA: Diagnosis not present

## 2021-07-16 DIAGNOSIS — E1169 Type 2 diabetes mellitus with other specified complication: Secondary | ICD-10-CM

## 2021-07-16 DIAGNOSIS — E785 Hyperlipidemia, unspecified: Secondary | ICD-10-CM

## 2021-07-16 DIAGNOSIS — I152 Hypertension secondary to endocrine disorders: Secondary | ICD-10-CM

## 2021-07-16 DIAGNOSIS — R809 Proteinuria, unspecified: Secondary | ICD-10-CM

## 2021-07-16 DIAGNOSIS — E1142 Type 2 diabetes mellitus with diabetic polyneuropathy: Secondary | ICD-10-CM | POA: Diagnosis not present

## 2021-07-16 DIAGNOSIS — E538 Deficiency of other specified B group vitamins: Secondary | ICD-10-CM

## 2021-07-16 MED ORDER — AMLODIPINE BESYLATE 5 MG PO TABS: 5.0000 mg | ORAL_TABLET | Freq: Every day | ORAL | 4 refills | Status: DC

## 2021-07-16 NOTE — Assessment & Plan Note (Signed)
BMI 31.04.  Recommended eating smaller high protein, low fat meals more frequently and exercising 30 mins a day 5 times a week with a goal of 10-15lb weight loss in the next 3 months. Patient voiced their understanding and motivation to adhere to these recommendations.  

## 2021-07-16 NOTE — Progress Notes (Signed)
BP 134/80    Pulse 83    Temp 99.1 F (37.3 C) (Oral)    Wt 175 lb 3.2 oz (79.5 kg)    LMP  (LMP Unknown)    SpO2 95%    BMI 31.04 kg/m    Subjective:    Patient ID: Julia Wood, female    DOB: [birth year], 60 y.o.   MRN: [id number]  HPI: Julia Wood is a 60 y.o. female  Chief Complaint  Patient presents with   Diabetes    Patient denies having a recent Diabetic Eye Exam.    Hypertension   Hyperlipidemia    DIABETES Last A1c was 7.3% September.  Initially started on Metformin in February 2020 with A1C 7.2%.  Taking Metformin one tablet twice a day (has only missed one night dose) and Gabapentin 600 MG at bedtime for neuropathy pain.   Hypoglycemic episodes:no Polydipsia/polyuria: no Visual disturbance: no Chest pain: no Paresthesias: no Glucose Monitoring: yes  Accucheck frequency: rarely  Fasting glucose: 99 to 110  Post prandial:  Evening:  Before meals: Taking Insulin?: no  Long acting insulin:  Short acting insulin: Blood Pressure Monitoring: a few times a week Retinal Examination: Not up to Date -- [place] Exam: Up to Date Pneumovax: Not up to Date Influenza: Not Up to Date Aspirin: yes   HYPERTENSION / HYPERLIPIDEMIA Continues on HCTZ, Amlodipine, and Crestor. Satisfied with current treatment? yes Duration of hypertension: chronic BP monitoring frequency: on occasion BP range: on average 130/70 range BP medication side effects: no Duration of hyperlipidemia: chronic Cholesterol medication side effects: no Cholesterol supplements: none Medication compliance: good compliance Aspirin: yes Recent stressors: no Recurrent headaches: no Visual changes: no Palpitations: no Dyspnea: no Chest pain: no Lower extremity edema: no Dizzy/lightheaded: no   Relevant past medical, surgical, family and social history reviewed and updated as indicated. Interim medical history since our last visit reviewed. Allergies and medications reviewed and  updated.  Review of Systems  Constitutional:  Negative for activity change, appetite change, diaphoresis, fatigue and fever.  Respiratory:  Negative for cough, chest tightness and shortness of breath.   Cardiovascular:  Negative for chest pain, palpitations and leg swelling.  Gastrointestinal: Negative.   Endocrine: Negative for cold intolerance, heat intolerance, polydipsia, polyphagia and polyuria.  Neurological: Negative.   Psychiatric/Behavioral: Negative.     Per HPI unless specifically indicated above     Objective:    BP 134/80    Pulse 83    Temp 99.1 F (37.3 C) (Oral)    Wt 175 lb 3.2 oz (79.5 kg)    LMP  (LMP Unknown)    SpO2 95%    BMI 31.04 kg/m   Wt Readings from Last 3 Encounters:  07/16/21 175 lb 3.2 oz (79.5 kg)  04/16/21 180 lb 12.8 oz (82 kg)  01/09/21 180 lb (81.6 kg)    Physical Exam Vitals and nursing note reviewed.  Constitutional:      General: She is awake. She is not in acute distress.    Appearance: She is well-developed and well-groomed. She is obese. She is not ill-appearing.  HENT:     Head: Normocephalic.     Right Ear: Hearing normal.     Left Ear: Hearing normal.  Eyes:     General: Lids are normal.        Right eye: No discharge.        Left eye: No discharge.     Conjunctiva/sclera: Conjunctivae normal.  Pupils: Pupils are equal, round, and reactive to light.  Neck:     Thyroid: No thyromegaly.     Vascular: No carotid bruit.  Cardiovascular:     Rate and Rhythm: Normal rate and regular rhythm.     Heart sounds: Normal heart sounds. No murmur heard.   No gallop.  Pulmonary:     Effort: Pulmonary effort is normal. No accessory muscle usage or respiratory distress.     Breath sounds: Normal breath sounds.  Abdominal:     General: Bowel sounds are normal.     Palpations: Abdomen is soft.  Musculoskeletal:     Cervical back: Normal range of motion and neck supple.     Right lower leg: No edema.     Left lower leg: No edema.   Skin:    General: Skin is warm and dry.  Neurological:     Mental Status: She is alert and oriented to person, place, and time.  Psychiatric:        Attention and Perception: Attention normal.        Mood and Affect: Mood normal.        Behavior: Behavior normal. Behavior is cooperative.        Thought Content: Thought content normal.        Judgment: Judgment normal.    Results for orders placed or performed in visit on 04/16/21  Bayer DCA Hb A1c Waived  Result Value Ref Range   HB A1C (BAYER DCA - WAIVED) 7.3 (H) 4.8 - 5.6 %  Comprehensive metabolic panel  Result Value Ref Range   Glucose 125 (H) 65 - 99 mg/dL   BUN 9 8 - 27 mg/dL   Creatinine, Ser 0.86 0.57 - 1.00 mg/dL   eGFR 77 >59 mL/min/1.73   BUN/Creatinine Ratio 10 (L) 12 - 28   Sodium 140 134 - 144 mmol/L   Potassium 3.6 3.5 - 5.2 mmol/L   Chloride 100 96 - 106 mmol/L   CO2 26 20 - 29 mmol/L   Calcium 9.5 8.7 - 10.3 mg/dL   Total Protein 6.2 6.0 - 8.5 g/dL   Albumin 4.2 3.8 - 4.8 g/dL   Globulin, Total 2.0 1.5 - 4.5 g/dL   Albumin/Globulin Ratio 2.1 1.2 - 2.2   Bilirubin Total <0.2 0.0 - 1.2 mg/dL   Alkaline Phosphatase 76 44 - 121 IU/L   AST 21 0 - 40 IU/L   ALT 20 0 - 32 IU/L  Lipid Panel w/o Chol/HDL Ratio  Result Value Ref Range   Cholesterol, Total 113 100 - 199 mg/dL   Triglycerides 228 (H) 0 - 149 mg/dL   HDL 29 (L) >39 mg/dL   VLDL Cholesterol Cal 36 5 - 40 mg/dL   LDL Chol Calc (NIH) 48 0 - 99 mg/dL  TSH  Result Value Ref Range   TSH 4.260 0.450 - 4.500 uIU/mL      Assessment & Plan:   Problem List Items Addressed This Visit       Cardiovascular and Mediastinum   Hypertension associated with diabetes ([place])    Chronic, ongoing with BP at goal in office today and at home.  Urine ALB 80 and A:C 30-300 March 2022 -- unable to take ACE or ARB due to history of cough.  Continue current regimen and adjust as needed.  Recommend she monitor BP at least a few mornings a week at home and document.  DASH  diet at home.  LABS: A1c.  Refills sent.  Return  in 3 months.       Relevant Medications   amLODipine (NORVASC) 5 MG tablet     Endocrine   Hyperlipidemia associated with type 2 diabetes mellitus (HCC)    Chronic, ongoing.  Continue current medication regimen and adjust as needed.  LDL 48 and TCHOL 113 recent visit, at goal.  Lipid panel up to date.      Relevant Medications   amLODipine (NORVASC) 5 MG tablet   Type 2 diabetes mellitus with peripheral neuropathy (HCC)    Chronic, ongoing.  A1C 7.3%, with slight trend upwards recent visit -- recheck today.  Continue Metformin 500 MG BID, no missing doses.  Continue checking BS at least fasting once weekly and document. 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  Return in 3 months.      Type 2 diabetes mellitus with proteinuria (HCC) - Primary    Chronic, ongoing.  A1C 7.3% recent visit, slight trend upward -- will recheck today.  Urine ALB 80 and A:C 30-300 March 2022 -- unable to take ACE or ARB due to history of cough.  Continue Metformin 500 MG BID, no missing doses -- discussed with her possibly adding injectable like Ozempic to help with weight loss, but she refuses this.  Continue checking BS at least fasting once weekly and document.  Diabetic diet focus, discussed with patient if trend upwards next visit will need to increase Metformin.  Return in 3 months.      Relevant Orders   HgB A1c     Other   Obesity (BMI 30-39.9)    BMI 31.04.  Recommended eating smaller high protein, low fat meals more frequently and exercising 30 mins a day 5 times a week with a goal of 10-15lb weight loss in the next 3 months. Patient voiced their understanding and motivation to adhere to these recommendations.       Other Visit Diagnoses     B12 deficiency       History of low levels, recheck today and initiate supplement as needed.   Relevant Orders   Vitamin B12   Pneumococcal vaccination given        PPSV23 today, discussed with patient and in agreeance.   Relevant Orders   Pneumococcal polysaccharide vaccine 23-valent greater than or equal to 2yo subcutaneous/IM       Follow up plan: Return in about 3 months (around 10/14/2021) for T2DM,HTN/HLD.

## 2021-07-16 NOTE — Assessment & Plan Note (Signed)
Chronic, ongoing.  Continue current medication regimen and adjust as needed.  LDL 48 and TCHOL 113 recent visit, at goal.  Lipid panel up to date.

## 2021-07-16 NOTE — Patient Instructions (Signed)

## 2021-07-16 NOTE — Assessment & Plan Note (Signed)
Chronic, ongoing.  A1C 7.3% recent visit, slight trend upward -- will recheck today.  Urine ALB 80 and A:C 30-300 March 2022 -- unable to take ACE or ARB due to history of cough.  Continue Metformin 500 MG BID, no missing doses -- discussed with her possibly adding injectable like Ozempic to help with weight loss, but she refuses this.  Continue checking BS at least fasting once weekly and document.  Diabetic diet focus, discussed with patient if trend upwards next visit will need to increase Metformin.  Return in 3 months.

## 2021-07-16 NOTE — Assessment & Plan Note (Signed)
Chronic, ongoing.  A1C 7.3%, with slight trend upwards recent visit -- recheck today.  Continue Metformin 500 MG BID, no missing doses.  Continue checking BS at least fasting once weekly and document. 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  Return in 3 months.

## 2021-07-16 NOTE — Assessment & Plan Note (Signed)
Chronic, ongoing with BP at goal in office today and at home.  Urine ALB 80 and A:C 30-300 March 2022 -- unable to take ACE or ARB due to history of cough.  Continue current regimen and adjust as needed.  Recommend she monitor BP at least a few mornings a week at home and document.  DASH diet at home.  LABS: A1c.  Refills sent.  Return in 3 months.

## 2021-07-17 ENCOUNTER — Other Ambulatory Visit: Payer: Self-pay | Admitting: Nurse Practitioner

## 2021-07-17 LAB — VITAMIN B12: Vitamin B-12: 607 pg/mL (ref 232–1245)

## 2021-07-17 LAB — HEMOGLOBIN A1C
Est. average glucose Bld gHb Est-mCnc: 160 mg/dL
Hgb A1c MFr Bld: 7.2 % — ABNORMAL HIGH (ref 4.8–5.6)

## 2021-07-17 MED ORDER — METFORMIN HCL 500 MG PO TABS: ORAL_TABLET | ORAL | 4 refills | Status: DC

## 2021-07-17 NOTE — Progress Notes (Signed)
Good morning, please let Julia Wood know her labs have returned.  B12 level is normal.  A1c remains at 7.2%.  I would like her to start taking Metformin 1000 MG in morning (2 tablets) and 500 MG in evening, please do not miss doses and focus heavily on diet over next few months.  Any questions? Keep being awesome!!  Thank you for allowing me to participate in your care.  I appreciate you. Kindest regards, Kharisma Glasner

## 2021-08-05 ENCOUNTER — Other Ambulatory Visit: Payer: Self-pay | Admitting: Nurse Practitioner

## 2021-08-06 NOTE — Telephone Encounter (Signed)
Requested medications are due for refill today.  yes  Requested medications are on the active medications list.  no  Last refill. none  Future visit scheduled.   yes  Notes to clinic.  It appears that pt has run out of the supplies that came with the test kit. Pt needs rx for lancets and test strips. Thank you!    Requested Prescriptions  Pending Prescriptions Disp Refills   ACCU-CHEK GUIDE test strip [Pharmacy Med Name: Accu-Chek Guide In Vitro Strip] 200 each 0    Sig: USE AS DIRECTED UP TO 4 TIMES DAILY     Endocrinology: Diabetes - Testing Supplies Passed - 08/05/2021  2:59 PM      Passed - Valid encounter within last 12 months    Recent Outpatient Visits           3 weeks ago Type 2 diabetes mellitus with proteinuria ([place])   [place], Jolene T, NP   3 months ago Type 2 diabetes mellitus with peripheral neuropathy ([place])   [place], Jolene T, NP   6 months ago Type 2 diabetes mellitus with proteinuria ([place])   [place], Jolene T, NP   10 months ago Type 2 diabetes mellitus with proteinuria ([place])   [place], Jolene T, NP   1 year ago Type 2 diabetes mellitus with proteinuria ([place])   [place], Barbaraann Faster, NP       Future Appointments             In 2 months Cannady, Barbaraann Faster, NP [facility], [place] [employer] Med Name: FASTCLIX LANCETS    MIS] 68 each 0    Sig: USE AS DIRECTED UP TO FOUR TIMES  DAILY     Endocrinology: Diabetes - Testing Supplies Passed - 08/05/2021  2:59 PM      Passed - Valid encounter within last 12 months    Recent Outpatient Visits           3 weeks ago Type 2 diabetes mellitus with proteinuria ([place])   [place], Jolene T, NP   3 months ago Type 2 diabetes mellitus with peripheral neuropathy ([place])   [place], Jolene T, NP   6  months ago Type 2 diabetes mellitus with proteinuria ([place])   [place], Jolene T, NP   10 months ago Type 2 diabetes mellitus with proteinuria ([place])   [place], Jolene T, NP   1 year ago Type 2 diabetes mellitus with proteinuria ([place])   [place], Barbaraann Faster, NP       Future Appointments             In 2 months Cannady, Barbaraann Faster, NP [facility], [facility]

## 2021-08-19 ENCOUNTER — Other Ambulatory Visit: Payer: Self-pay | Admitting: Nurse Practitioner

## 2021-08-19 NOTE — Telephone Encounter (Signed)
[facility] called and spoke to [place], [employer]chant [employer] officer about the refill(s) Rosuvastatin requested. Advised it was sent on 04/16/21 #90/4 refill(s). She says they have it on file. I will refuse this request due to this.  Requested Prescriptions  Pending Prescriptions Disp Refills   rosuvastatin (CRESTOR) 10 MG tablet [Pharmacy Med Name: Rosuvastatin Calcium 10 MG Oral Tablet] 90 tablet 0    Sig: Take 1 tablet by mouth once daily     Cardiovascular:  Antilipid - Statins Failed - 08/19/2021 10:51 AM      Failed - HDL in normal range and within 360 days    HDL  Date Value Ref Range Status  04/16/2021 29 (L) >39 mg/dL Final          Failed - Triglycerides in normal range and within 360 days    Triglycerides  Date Value Ref Range Status  04/16/2021 228 (H) 0 - 149 mg/dL Final   Triglycerides Piccolo,Waived  Date Value Ref Range Status  09/22/2019 219 (H) <150 mg/dL Final    Comment:                            Normal                   <150                         Borderline High     150 - 199                         High                200 - 499                         Very High                >499           Passed - Total Cholesterol in normal range and within 360 days    Cholesterol, Total  Date Value Ref Range Status  04/16/2021 113 100 - 199 mg/dL Final   Cholesterol Piccolo, Waived  Date Value Ref Range Status  09/22/2019 119 <200 mg/dL Final    Comment:                            Desirable                <200                         Borderline High      200- 239                         High                     >239           Passed - LDL in normal range and within 360 days    LDL Chol Calc (NIH)  Date Value Ref Range Status  04/16/2021 48 0 - 99 mg/dL Final          Passed - Patient is not pregnant      Passed - Valid encounter within last 12 months  Recent Outpatient Visits           1 month ago Type 2 diabetes mellitus with proteinuria ([place])   [place], Jolene T, NP   4 months ago Type 2 diabetes mellitus with peripheral neuropathy ([place])   [place], Jolene T, NP   7 months ago Type 2 diabetes mellitus with proteinuria ([place])   [place], Jolene T, NP   10 months ago Type 2 diabetes mellitus with proteinuria ([place])   [place], Jolene T, NP   1 year ago Type 2 diabetes mellitus with proteinuria ([place])   [place], Barbaraann Faster, NP       Future Appointments             In 1 month Cannady, Barbaraann Faster, NP [facility]

## 2021-08-19 NOTE — Telephone Encounter (Signed)
Attempted to reach pharmacy, available refills. Extended hold, extended ring. Will attempt to reach later.

## 2021-10-11 NOTE — Patient Instructions (Incomplete)

## 2021-10-14 ENCOUNTER — Ambulatory Visit: Payer: Self-pay | Admitting: Nurse Practitioner

## 2021-10-14 DIAGNOSIS — E785 Hyperlipidemia, unspecified: Secondary | ICD-10-CM

## 2021-10-14 DIAGNOSIS — E1142 Type 2 diabetes mellitus with diabetic polyneuropathy: Secondary | ICD-10-CM

## 2021-10-14 DIAGNOSIS — E1169 Type 2 diabetes mellitus with other specified complication: Secondary | ICD-10-CM

## 2021-10-14 DIAGNOSIS — E559 Vitamin D deficiency, unspecified: Secondary | ICD-10-CM

## 2021-10-14 DIAGNOSIS — I152 Hypertension secondary to endocrine disorders: Secondary | ICD-10-CM

## 2021-10-14 DIAGNOSIS — E1129 Type 2 diabetes mellitus with other diabetic kidney complication: Secondary | ICD-10-CM

## 2021-10-14 DIAGNOSIS — E1159 Type 2 diabetes mellitus with other circulatory complications: Secondary | ICD-10-CM

## 2021-10-14 DIAGNOSIS — R809 Proteinuria, unspecified: Secondary | ICD-10-CM

## 2021-10-14 DIAGNOSIS — E669 Obesity, unspecified: Secondary | ICD-10-CM

## 2021-11-23 NOTE — Patient Instructions (Signed)

## 2021-11-28 ENCOUNTER — Encounter: Payer: Self-pay | Admitting: Nurse Practitioner

## 2021-11-28 ENCOUNTER — Ambulatory Visit: Payer: 59 | Admitting: Nurse Practitioner

## 2021-11-28 VITALS — BP 137/84 | HR 81 | Temp 98.8°F | Wt 173.6 lb

## 2021-11-28 DIAGNOSIS — E1159 Type 2 diabetes mellitus with other circulatory complications: Secondary | ICD-10-CM

## 2021-11-28 DIAGNOSIS — E1169 Type 2 diabetes mellitus with other specified complication: Secondary | ICD-10-CM

## 2021-11-28 DIAGNOSIS — K089 Disorder of teeth and supporting structures, unspecified: Secondary | ICD-10-CM

## 2021-11-28 DIAGNOSIS — R809 Proteinuria, unspecified: Secondary | ICD-10-CM

## 2021-11-28 DIAGNOSIS — E669 Obesity, unspecified: Secondary | ICD-10-CM

## 2021-11-28 DIAGNOSIS — Z86010 Personal history of colonic polyps: Secondary | ICD-10-CM

## 2021-11-28 DIAGNOSIS — E1142 Type 2 diabetes mellitus with diabetic polyneuropathy: Secondary | ICD-10-CM | POA: Diagnosis not present

## 2021-11-28 DIAGNOSIS — I152 Hypertension secondary to endocrine disorders: Secondary | ICD-10-CM

## 2021-11-28 DIAGNOSIS — E1129 Type 2 diabetes mellitus with other diabetic kidney complication: Secondary | ICD-10-CM

## 2021-11-28 DIAGNOSIS — E785 Hyperlipidemia, unspecified: Secondary | ICD-10-CM

## 2021-11-28 LAB — MICROALBUMIN, URINE WAIVED
Creatinine, Urine Waived: 100 mg/dL (ref 10–300)
Microalb, Ur Waived: 30 mg/L — ABNORMAL HIGH (ref 0–19)
Microalb/Creat Ratio: <30 mg/g (ref <30)

## 2021-11-28 LAB — BAYER DCA HB A1C WAIVED: HB A1C (BAYER DCA - WAIVED): 6.7 % — ABNORMAL HIGH (ref 4.8–5.6)

## 2021-11-28 NOTE — Assessment & Plan Note (Signed)
Recommend return for yearly colonoscopy, will plan on referral next visit as currently working on teeth extraction and dentures. ?

## 2021-11-28 NOTE — Assessment & Plan Note (Signed)
Chronic, ongoing.  Continue current medication regimen and adjust as needed. Lipid panel today. 

## 2021-11-28 NOTE — Assessment & Plan Note (Addendum)
Chronic, ongoing.  A1c 7.2% last visit, will recheck today.  Continue Metformin 1000 MG in morning and 500 MG at night, no missing doses.  Continue checking BS at least fasting once weekly and document. 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  Return in 5 months. ?

## 2021-11-28 NOTE — Assessment & Plan Note (Signed)
Chronic, ongoing with BP at goal in office today and at home.  Urine ALB 80 and A:C 30-300 March 2022 -- unable to take ACE or ARB due to history of cough -- will recheck today.  Continue current regimen and adjust as needed.  Recommend she monitor BP at least a few mornings a week at home and document.  DASH diet at home.  LABS: A1c, CMP, urine ALB.  Return in 5 months. ? ?

## 2021-11-28 NOTE — Assessment & Plan Note (Signed)
BMI 30.75.  Recommended eating smaller high protein, low fat meals more frequently and exercising 30 mins a day 5 times a week with a goal of 10-15lb weight loss in the next 3 months. Patient voiced their understanding and motivation to adhere to these recommendations.  

## 2021-11-28 NOTE — Progress Notes (Signed)
? ?BP 137/84   Pulse 81   Temp 98.8 ?F (37.1 ?C) (Oral)   Wt 173 lb 9.6 oz (78.7 kg)   LMP  (LMP Unknown)   SpO2 98%   BMI 30.75 kg/m?   ? ?Subjective:  ? ? Patient ID: Julia Wood, female    DOB: 1961-02-08, 61 y.o.   MRN: [id number] ? ?HPI: ?Julia Wood is a 61 y.o. female ? ?Chief Complaint  ?Patient presents with  ? New Med Request  ?  Pt reports having dental procedure soon and would like to discuss having pain medication prescribed   ? ?Is going to dentist soon, [facility], having all teeth pulled.  Having this done at end of next month.  They told her they will not put to sleep because of medication she is on, so she is in need of pain management for this.  Feels they may numb her, but they have told her no other sedation.   ? ?DIABETES ?Last A1c was 7.2% in December.  Initially started on Metformin in February 2020.  Taking Metformin 1000 MG in the morning and 500 MG at night + Gabapentin 600 MG at bedtime for neuropathy pain.   ?Hypoglycemic episodes:no ?Polydipsia/polyuria: no ?Visual disturbance: no ?Chest pain: no ?Paresthesias: no ?Glucose Monitoring: yes ? Accucheck frequency: rarely ? Fasting glucose: 1106 to 130 ? Post prandial: ? Evening: ? Before meals: ?Taking Insulin?: no ? Long acting insulin: ? Short acting insulin: ?Blood Pressure Monitoring: a few times a week ?Retinal Examination: Not up to Date -- [place] ?Foot Exam: Up to Date ?Pneumovax: Not up to Date ?Influenza: Not Up to Date ?Aspirin: yes  ? ?HYPERTENSION / HYPERLIPIDEMIA ?Continues on HCTZ, Amlodipine, and Crestor. ?Satisfied with current treatment? yes ?Duration of hypertension: chronic ?BP monitoring frequency: on occasion ?BP range: on average 130/70 range ?BP medication side effects: no ?Duration of hyperlipidemia: chronic ?Cholesterol medication side effects: no ?Cholesterol supplements: none ?Medication compliance: good compliance ?Aspirin: yes ?Recent stressors: no ?Recurrent headaches: no ?Visual  changes: no ?Palpitations: no ?Dyspnea: no ?Chest pain: no ?Lower extremity edema: no ?Dizzy/lightheaded: no  ? ?Relevant past medical, surgical, family and social history reviewed and updated as indicated. Interim medical history since our last visit reviewed. ?Allergies and medications reviewed and updated. ? ?Review of Systems  ?Constitutional:  Negative for activity change, appetite change, diaphoresis, fatigue and fever.  ?Respiratory:  Negative for cough, chest tightness and shortness of breath.   ?Cardiovascular:  Negative for chest pain, palpitations and leg swelling.  ?Gastrointestinal: Negative.   ?Endocrine: Negative for cold intolerance, heat intolerance, polydipsia, polyphagia and polyuria.  ?Neurological: Negative.   ?Psychiatric/Behavioral: Negative.    ? ?Per HPI unless specifically indicated above ? ?   ?Objective:  ?  ?BP 137/84   Pulse 81   Temp 98.8 ?F (37.1 ?C) (Oral)   Wt 173 lb 9.6 oz (78.7 kg)   LMP  (LMP Unknown)   SpO2 98%   BMI 30.75 kg/m?   ?Wt Readings from Last 3 Encounters:  ?11/28/21 173 lb 9.6 oz (78.7 kg)  ?07/16/21 175 lb 3.2 oz (79.5 kg)  ?04/16/21 180 lb 12.8 oz (82 kg)  ?  ?Physical Exam ?Vitals and nursing note reviewed.  ?Constitutional:   ?   General: She is awake. She is not in acute distress. ?   Appearance: She is well-developed and well-groomed. She is obese. She is not ill-appearing.  ?HENT:  ?   Head: Normocephalic.  ?   Right  Ear: Hearing normal.  ?   Left Ear: Hearing normal.  ?Eyes:  ?   General: Lids are normal.     ?   Right eye: No discharge.     ?   Left eye: No discharge.  ?   Conjunctiva/sclera: Conjunctivae normal.  ?   Pupils: Pupils are equal, round, and reactive to light.  ?Neck:  ?   Thyroid: No thyromegaly.  ?   Vascular: No carotid bruit.  ?Cardiovascular:  ?   Rate and Rhythm: Normal rate and regular rhythm.  ?   Heart sounds: Normal heart sounds. No murmur heard. ?  No gallop.  ?Pulmonary:  ?   Effort: Pulmonary effort is normal. No accessory  muscle usage or respiratory distress.  ?   Breath sounds: Normal breath sounds.  ?Abdominal:  ?   General: Bowel sounds are normal.  ?   Palpations: Abdomen is soft.  ?Musculoskeletal:  ?   Cervical back: Normal range of motion and neck supple.  ?   Right lower leg: No edema.  ?   Left lower leg: No edema.  ?Skin: ?   General: Skin is warm and dry.  ?Neurological:  ?   Mental Status: She is alert and oriented to person, place, and time.  ?Psychiatric:     ?   Attention and Perception: Attention normal.     ?   Mood and Affect: Mood normal.     ?   Behavior: Behavior normal. Behavior is cooperative.     ?   Thought Content: Thought content normal.     ?   Judgment: Judgment normal.  ?  ?Results for orders placed or performed in visit on 07/16/21  ?Vitamin B12  ?Result Value Ref Range  ? Vitamin B-12 607 232 - 1,245 pg/mL  ?HgB A1c  ?Result Value Ref Range  ? Hgb A1c MFr Bld 7.2 (H) 4.8 - 5.6 %  ? Est. average glucose Bld gHb Est-mCnc 160 mg/dL  ? ?   ?Assessment & Plan:  ? ?Problem List Items Addressed This Visit   ? ?  ? Cardiovascular and Mediastinum  ? Hypertension associated with diabetes ([place])  ?  Chronic, ongoing with BP at goal in office today and at home.  Urine ALB 80 and A:C 30-300 March 2022 -- unable to take ACE or ARB due to history of cough -- will recheck today.  Continue current regimen and adjust as needed.  Recommend she monitor BP at least a few mornings a week at home and document.  DASH diet at home.  LABS: A1c, CMP, urine ALB.  Return in 5 months. ? ? ?  ?  ? Relevant Orders  ? Bayer DCA Hb A1c Waived  ? Microalbumin, Urine Waived  ? Comprehensive metabolic panel  ?  ? Endocrine  ? Hyperlipidemia associated with type 2 diabetes mellitus ([place])  ?  Chronic, ongoing.  Continue current medication regimen and adjust as needed.  Lipid panel today. ? ?  ?  ? Relevant Orders  ? Bayer DCA Hb A1c Waived  ? Comprehensive metabolic panel  ? Lipid Panel w/o Chol/HDL Ratio  ? Type 2 diabetes mellitus with  peripheral neuropathy (HCC)  ?  Chronic, ongoing.  A1c 7.2% last visit, will recheck today.  Continue Metformin 1000 MG in morning and 500 MG at night, no missing doses.  Continue checking BS at least fasting once weekly and document. Will continue Gabapentin 300 - 600 MG prior to bed for neuropathy  discomfort at work.  Return in 5 months. ?  ?  ? Relevant Orders  ? Bayer DCA Hb A1c Waived  ? Microalbumin, Urine Waived  ? Comprehensive metabolic panel  ? Type 2 diabetes mellitus with proteinuria (HCC) - Primary  ?  Chronic, ongoing.  A1c 7.2% last visit, will recheck today and recheck urine ALB.  Continue Metformin 1000 MG in morning and 500 MG at night, no missing doses.  Continue checking BS at least fasting once weekly and document. 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  Return in 5 months. ?  ?  ? Relevant Orders  ? Bayer DCA Hb A1c Waived  ? Microalbumin, Urine Waived  ? Comprehensive metabolic panel  ?  ? Other  ? History of colonic polyps  ?  Recommend return for yearly colonoscopy, will plan on referral next visit as currently working on teeth extraction and dentures. ? ?  ?  ? Obesity (BMI 30-39.9)  ?  BMI 30.75.  Recommended eating smaller high protein, low fat meals more frequently and exercising 30 mins a day 5 times a week with a goal of 10-15lb weight loss in the next 3 months. Patient voiced their understanding and motivation to adhere to these recommendations. ? ? ?  ?  ? ?Other Visit Diagnoses   ? ? Poor dentition      ? Will check with [facility] to see needs and recommendations for this patient.  ? ?  ? ? ?Follow up plan: ?Return in about 5 months (around 04/30/2022) for Annual physical -- pap, colonoscopy, and vaccines. ?

## 2021-11-28 NOTE — Assessment & Plan Note (Addendum)
Chronic, ongoing.  A1c 7.2% last visit, will recheck today and recheck urine ALB.  Continue Metformin 1000 MG in morning and 500 MG at night, no missing doses.  Continue checking BS at least fasting once weekly and document. 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  Return in 5 months. ?

## 2021-11-28 NOTE — Progress Notes (Signed)
Please let Julia Wood know her A1c is coming down, now at 6.7%.  Continue Metformin as ordered!!

## 2021-11-29 LAB — COMPREHENSIVE METABOLIC PANEL
ALT: 16 IU/L (ref 0–32)
AST: 15 IU/L (ref 0–40)
Albumin/Globulin Ratio: 1.9 (ref 1.2–2.2)
Albumin: 3.9 g/dL (ref 3.8–4.8)
Alkaline Phosphatase: 62 IU/L (ref 44–121)
BUN/Creatinine Ratio: 14 (ref 12–28)
BUN: 10 mg/dL (ref 8–27)
Bilirubin Total: <0.2 mg/dL (ref 0.0–1.2)
CO2: 28 mmol/L (ref 20–29)
Calcium: 9.3 mg/dL (ref 8.7–10.3)
Chloride: 98 mmol/L (ref 96–106)
Creatinine, Ser: 0.72 mg/dL (ref 0.57–1.00)
Globulin, Total: 2.1 g/dL (ref 1.5–4.5)
Glucose: 123 mg/dL — ABNORMAL HIGH (ref 70–99)
Potassium: 3.4 mmol/L — ABNORMAL LOW (ref 3.5–5.2)
Sodium: 140 mmol/L (ref 134–144)
Total Protein: 6 g/dL (ref 6.0–8.5)
eGFR: 94 mL/min/{1.73_m2} (ref >59)

## 2021-11-29 LAB — LIPID PANEL W/O CHOL/HDL RATIO
Cholesterol, Total: 108 mg/dL (ref 100–199)
HDL: 29 mg/dL — ABNORMAL LOW (ref >39)
LDL Chol Calc (NIH): 48 mg/dL (ref 0–99)
Triglycerides: 190 mg/dL — ABNORMAL HIGH (ref 0–149)
VLDL Cholesterol Cal: 31 mg/dL (ref 5–40)

## 2021-11-29 NOTE — Progress Notes (Signed)
Good morning crew, please let [place] know her labs have returned: ?- Kidney and liver function remain stable.   ?- Cholesterol levels are at goal. ?- Potassium level is a little on low side, please add some potassium rich foods to diet.  This includes mangoes, bananas, dried fruit, avocados, raisin bran.  You are on Hydrochlorothiazide which can lower potassium levels, so it is important to add some potassium rich foods to diet.  Will recheck next visit.  Any questions? ?Keep being amazing!!  Thank you for allowing me to participate in your care.  I appreciate you. ?Kindest regards, ?Lucinda Spells ?

## 2021-12-02 ENCOUNTER — Telehealth: Payer: Self-pay

## 2021-12-02 NOTE — Telephone Encounter (Signed)
-----   Message from Venita Lick, NP sent at 11/28/2021 11:32 AM EDT ----- ?This patient is going to [facility] at the end of next month for full teeth extraction and they have told her they can not put her to sleep due to current medications.  Can you call over there and see if we hold medications a few days before procedure, if they can sedate her and if not do they have conscious sedation as she is very nervous about this and asking for pain management.  If neither can be used what are they okay with me prescribing for nerves prior to procedure or if they can provided some type of pain or nerve management for this. ?

## 2021-12-02 NOTE — Telephone Encounter (Signed)
Called and LVM with [facility] asking for a call back to discuss providers concerns.  ?

## 2021-12-04 NOTE — Telephone Encounter (Signed)
Called and LVM asking for a return call to discuss medications. ?

## 2021-12-09 NOTE — Telephone Encounter (Signed)
Called and spoke with someone at [facility]. She states that the patient actually did not schedule her appointment with them. Also states that they do not out any patients to sleep at all in this clinic. Only local anesthetic is used.  ?

## 2022-01-17 IMAGING — MG MM DIGITAL SCREENING BILAT W/ TOMO AND CAD
8 series · 8 of 24 positions shown · non-contrast
Comparison: None.

CLINICAL DATA: Screening.

EXAM:
DIGITAL SCREENING BILATERAL MAMMOGRAM WITH TOMOSYNTHESIS AND CAD
TECHNIQUE: Bilateral screening digital craniocaudal and mediolateral oblique
mammograms were obtained. Bilateral screening digital breast
tomosynthesis was performed. The images were evaluated with
computer-aided detection.

[L CC synth-2D]
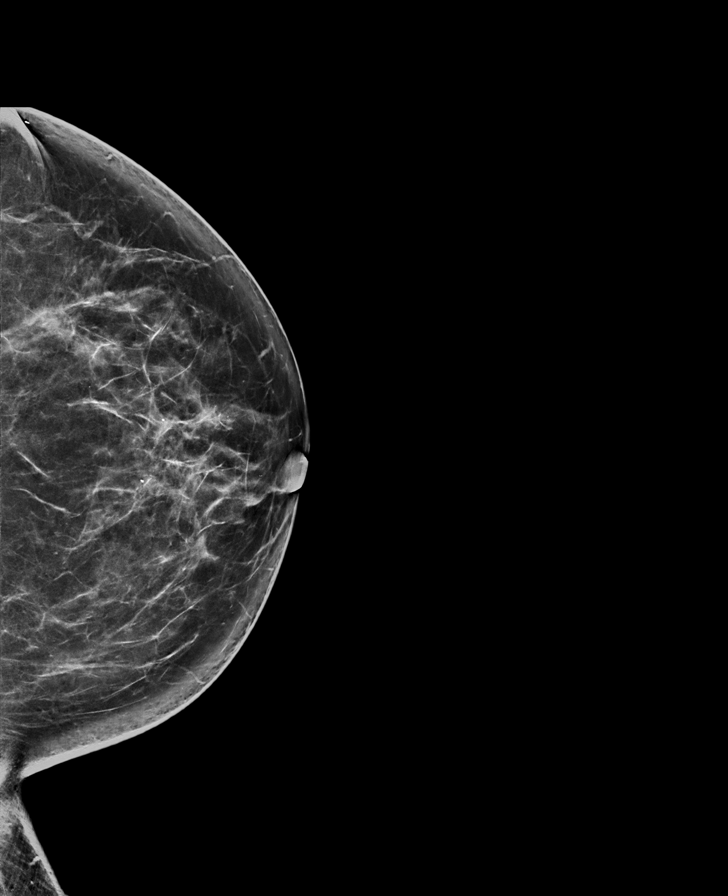

[R CC synth-2D]
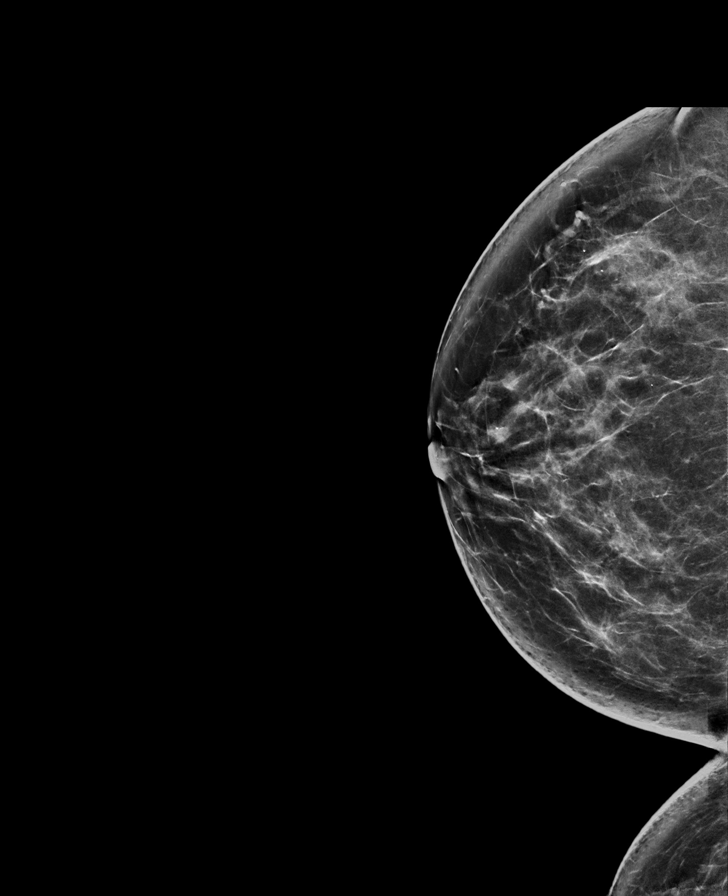

[R MLO synth-2D]
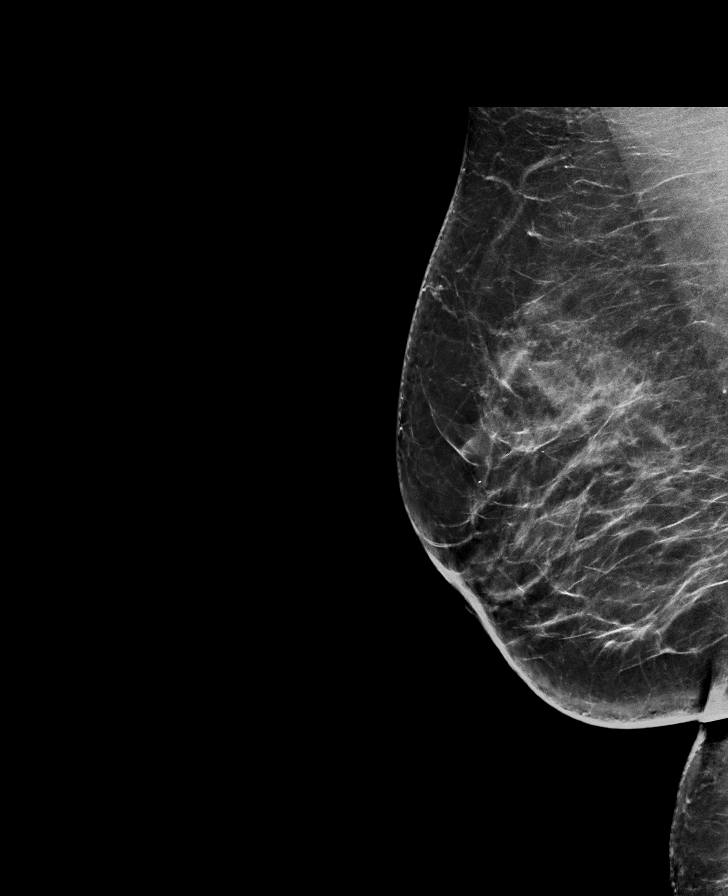

[L MLO synth-2D]
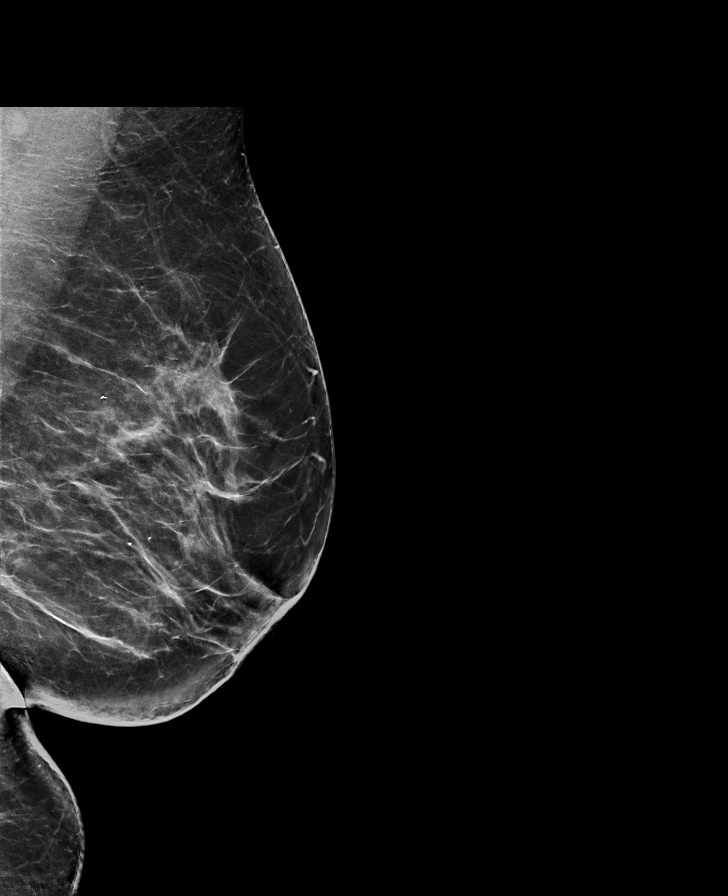

[R CC tomo · tomo slice 41/82.0]
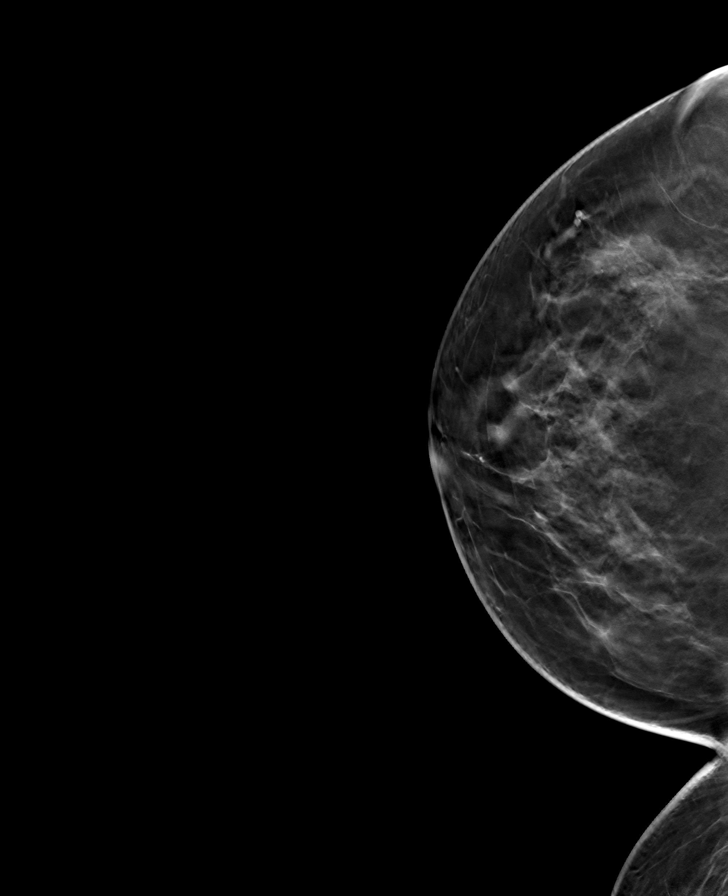

[L MLO tomo · tomo slice 43/86.0]
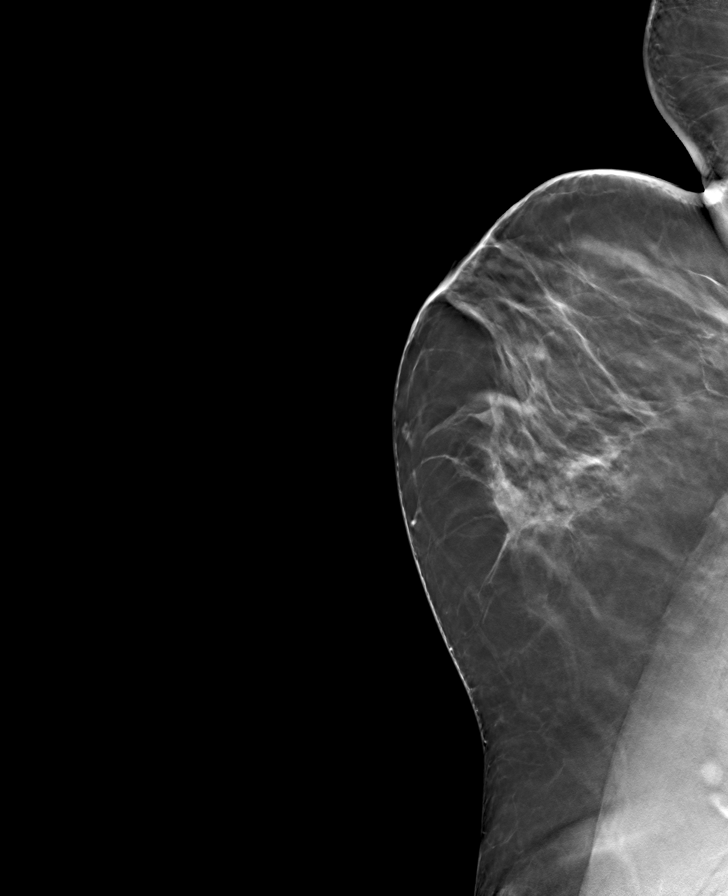

[L CC tomo · tomo slice 41/80.0]
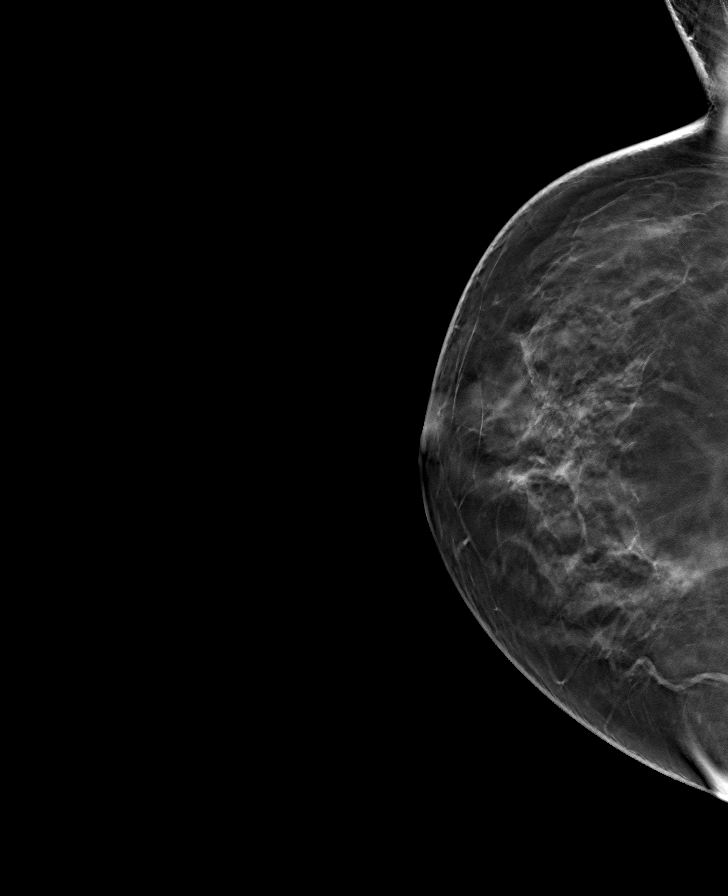

[R MLO tomo · tomo slice 43/85.0]
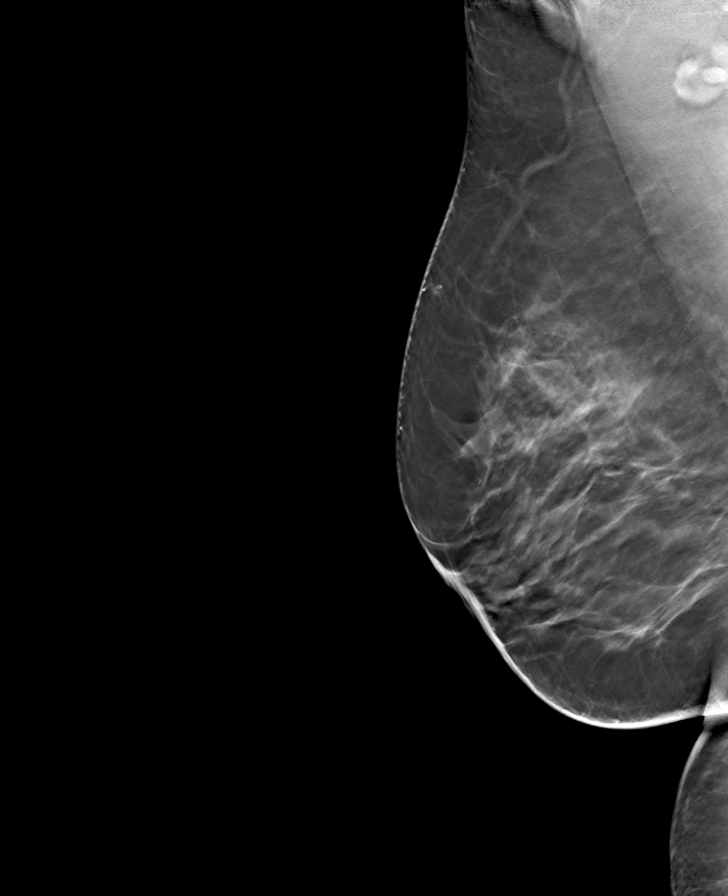

[8 of 24 positions shown; findings below may reference images not displayed]

ACR Breast Density Category c: The breast tissue is heterogeneously
dense, which may obscure small masses.
FINDINGS: In the right breast possible asymmetries require further evaluation.

In the left breast a possible asymmetry requires further evaluation.
IMPRESSION: Further evaluation is suggested for possible asymmetries in the
right breast.

Further evaluation is suggested for possible asymmetry in the left
breast.

RECOMMENDATION:
Diagnostic mammogram and possibly ultrasound of both breasts.
(Code:[id number])

The patient will be contacted regarding the findings, and additional
imaging will be scheduled.

BI-RADS CATEGORY  0: Incomplete. Need additional imaging evaluation
and/or prior mammograms for comparison.

## 2022-04-30 ENCOUNTER — Encounter: Payer: Self-pay | Admitting: Nurse Practitioner

## 2022-04-30 DIAGNOSIS — E1169 Type 2 diabetes mellitus with other specified complication: Secondary | ICD-10-CM

## 2022-04-30 DIAGNOSIS — R809 Proteinuria, unspecified: Secondary | ICD-10-CM

## 2022-04-30 DIAGNOSIS — E1142 Type 2 diabetes mellitus with diabetic polyneuropathy: Secondary | ICD-10-CM

## 2022-04-30 DIAGNOSIS — E6609 Other obesity due to excess calories: Secondary | ICD-10-CM

## 2022-04-30 DIAGNOSIS — Z124 Encounter for screening for malignant neoplasm of cervix: Secondary | ICD-10-CM

## 2022-04-30 DIAGNOSIS — I152 Hypertension secondary to endocrine disorders: Secondary | ICD-10-CM

## 2022-04-30 DIAGNOSIS — Z0000 Encounter for general adult medical examination without abnormal findings: Secondary | ICD-10-CM

## 2022-04-30 DIAGNOSIS — Z86010 Personal history of colonic polyps: Secondary | ICD-10-CM

## 2022-05-06 ENCOUNTER — Ambulatory Visit: Payer: 59 | Admitting: Nurse Practitioner

## 2022-05-10 NOTE — Patient Instructions (Signed)
Please call to schedule your mammogram and/or bone density: [facility]  Address: [address], [place], [place] [address] Phone: [phone]  [facility] [place] [address] [place],  [place]  [address] Phone: [phone]    Diabetes Mellitus Basics  Diabetes mellitus, or diabetes, is a long-term (chronic) disease. It occurs when the body does not properly use sugar (glucose) that is released from food after you eat. Diabetes mellitus may be caused by one or both of these problems: Your pancreas does not make enough of a hormone called insulin. Your body does not react in a normal way to the insulin that it makes. Insulin lets glucose enter cells in your body. This gives you energy. If you have diabetes, glucose cannot get into cells. This causes high blood glucose (hyperglycemia). How to treat and manage diabetes You may need to take insulin or other diabetes medicines daily to keep your glucose in balance. If you are prescribed insulin, you will learn how to give yourself insulin by injection. You may need to adjust the amount of insulin you take based on the foods that you eat. You will need to check your blood glucose levels using a glucose monitor as told by your health care provider. The readings can help determine if you have low or high blood glucose. Generally, you should have these blood glucose levels: Before meals (preprandial): 80-130 mg/dL (4.4-7.2 mmol/L). After meals (postprandial): below 180 mg/dL (10 mmol/L). Hemoglobin A1c (HbA1c) level: less than 7%. Your health care provider will set treatment goals for you. Keep all follow-up visits. This is important. Follow these instructions at home: Diabetes medicines Take your diabetes medicines every day as told by your health care provider. List your diabetes medicines here: Name of medicine: ______________________________ Amount (dose):  _______________ Time (a.m./p.m.): _______________ Notes: ___________________________________ Name of medicine: ______________________________ Amount (dose): _______________ Time (a.m./p.m.): _______________ Notes: ___________________________________ Name of medicine: ______________________________ Amount (dose): _______________ Time (a.m./p.m.): _______________ Notes: ___________________________________ Insulin If you use insulin, list the types of insulin you use here: Insulin type: ______________________________ Amount (dose): _______________ Time (a.m./p.m.): _______________Notes: ___________________________________ Insulin type: ______________________________ Amount (dose): _______________ Time (a.m./p.m.): _______________ Notes: ___________________________________ Insulin type: ______________________________ Amount (dose): _______________ Time (a.m./p.m.): _______________ Notes: ___________________________________ Insulin type: ______________________________ Amount (dose): _______________ Time (a.m./p.m.): _______________ Notes: ___________________________________ Insulin type: ______________________________ Amount (dose): _______________ Time (a.m./p.m.): _______________ Notes: ___________________________________ Managing blood glucose  Check your blood glucose levels using a glucose monitor as told by your health care provider. Write down the times that you check your glucose levels here: Time: _______________ Notes: ___________________________________ Time: _______________ Notes: ___________________________________ Time: _______________ Notes: ___________________________________ Time: _______________ Notes: ___________________________________ Time: _______________ Notes: ___________________________________ Time: _______________ Notes: ___________________________________  Low blood glucose Low blood glucose (hypoglycemia) is when glucose is at or below 70 mg/dL (3.9 mmol/L).  Symptoms may include: Feeling: Hungry. Sweaty and clammy. Irritable or easily upset. Dizzy. Sleepy. Having: A fast heartbeat. A headache. A change in your vision. Numbness around the mouth, lips, or tongue. Having trouble with: Moving (coordination). Sleeping. Treating low blood glucose To treat low blood glucose, eat or drink something containing sugar right away. If you can think clearly and swallow safely, follow the 15:15 rule: Take 15 grams of a fast-acting carb (carbohydrate), as told by your health care provider. Some fast-acting carbs are: Glucose tablets: take 3-4 tablets. Hard candy: eat 3-5 pieces. Fruit juice: drink 4 oz (120 mL). Regular (not diet) soda: drink 4-6 oz (120-180 mL).   Honey or sugar: eat 1 Tbsp (15 mL). Check your blood glucose levels 15 minutes after you take the carb. If your glucose is still at or below 70 mg/dL (3.9 mmol/L), take 15 grams of a carb again. If your glucose does not go above 70 mg/dL (3.9 mmol/L) after 3 tries, get help right away. After your glucose goes back to normal, eat a meal or a snack within 1 hour. Treating very low blood glucose If your glucose is at or below 54 mg/dL (3 mmol/L), you have very low blood glucose (severe hypoglycemia). This is an emergency. Do not wait to see if the symptoms will go away. Get medical help right away. Call your local emergency services (911 in the [place] Do not drive yourself to the hospital. Questions to ask your health care provider Should I talk with a diabetes educator? What equipment will I need to care for myself at home? What diabetes medicines do I need? When should I take them? How often do I need to check my blood glucose levels? What number can I call if I have questions? When is my follow-up visit? Where can I find a support group for people with diabetes? Where to find more information American Diabetes Association: www.diabetes.org Association of Diabetes Care and Education  Specialists: www.diabeteseducator.org Contact a health care provider if: Your blood glucose is at or above 240 mg/dL (13.3 mmol/L) for 2 days in a row. You have been sick or have had a fever for 2 days or more, and you are not getting better. You have any of these problems for more than 6 hours: You cannot eat or drink. You feel nauseous. You vomit. You have diarrhea. Get help right away if: Your blood glucose is lower than 54 mg/dL (3 mmol/L). You get confused. You have trouble thinking clearly. You have trouble breathing. These symptoms may represent a serious problem that is an emergency. Do not wait to see if the symptoms will go away. Get medical help right away. Call your local emergency services (911 in the [place] Do not drive yourself to the hospital. Summary Diabetes mellitus is a chronic disease that occurs when the body does not properly use sugar (glucose) that is released from food after you eat. Take insulin and diabetes medicines as told. Check your blood glucose every day, as often as told. Keep all follow-up visits. This is important. This information is not intended to replace advice given to you by your health care provider. Make sure you discuss any questions you have with your health care provider. Document Revised: 11/21/2019 Document Reviewed: 11/21/2019 Elsevier Patient Education  2023 [employer]  

## 2022-05-11 ENCOUNTER — Telehealth: Payer: Self-pay

## 2022-05-11 ENCOUNTER — Other Ambulatory Visit: Payer: Self-pay

## 2022-05-11 ENCOUNTER — Encounter: Payer: Self-pay | Admitting: Nurse Practitioner

## 2022-05-11 ENCOUNTER — Ambulatory Visit (INDEPENDENT_AMBULATORY_CARE_PROVIDER_SITE_OTHER): Payer: 59 | Admitting: Nurse Practitioner

## 2022-05-11 VITALS — BP 137/84 | HR 60 | Temp 97.9°F | Ht 64.0 in | Wt 176.6 lb

## 2022-05-11 DIAGNOSIS — E1169 Type 2 diabetes mellitus with other specified complication: Secondary | ICD-10-CM

## 2022-05-11 DIAGNOSIS — Z1231 Encounter for screening mammogram for malignant neoplasm of breast: Secondary | ICD-10-CM | POA: Diagnosis not present

## 2022-05-11 DIAGNOSIS — Z86010 Personal history of colonic polyps: Secondary | ICD-10-CM

## 2022-05-11 DIAGNOSIS — E1129 Type 2 diabetes mellitus with other diabetic kidney complication: Secondary | ICD-10-CM | POA: Diagnosis not present

## 2022-05-11 DIAGNOSIS — Z124 Encounter for screening for malignant neoplasm of cervix: Secondary | ICD-10-CM

## 2022-05-11 DIAGNOSIS — E785 Hyperlipidemia, unspecified: Secondary | ICD-10-CM | POA: Diagnosis not present

## 2022-05-11 DIAGNOSIS — Z1211 Encounter for screening for malignant neoplasm of colon: Secondary | ICD-10-CM | POA: Diagnosis not present

## 2022-05-11 DIAGNOSIS — E6609 Other obesity due to excess calories: Secondary | ICD-10-CM

## 2022-05-11 DIAGNOSIS — E1142 Type 2 diabetes mellitus with diabetic polyneuropathy: Secondary | ICD-10-CM

## 2022-05-11 DIAGNOSIS — Z6830 Body mass index (BMI) 30.0-30.9, adult: Secondary | ICD-10-CM | POA: Diagnosis not present

## 2022-05-11 DIAGNOSIS — Z0000 Encounter for general adult medical examination without abnormal findings: Secondary | ICD-10-CM | POA: Diagnosis not present

## 2022-05-11 DIAGNOSIS — I152 Hypertension secondary to endocrine disorders: Secondary | ICD-10-CM | POA: Diagnosis not present

## 2022-05-11 DIAGNOSIS — E1159 Type 2 diabetes mellitus with other circulatory complications: Secondary | ICD-10-CM | POA: Diagnosis not present

## 2022-05-11 DIAGNOSIS — R809 Proteinuria, unspecified: Secondary | ICD-10-CM | POA: Diagnosis not present

## 2022-05-11 LAB — BAYER DCA HB A1C WAIVED: HB A1C (BAYER DCA - WAIVED): 6.8 % — ABNORMAL HIGH (ref 4.8–5.6)

## 2022-05-11 MED ORDER — GABAPENTIN 300 MG PO CAPS: 600.0000 mg | ORAL_CAPSULE | Freq: Every day | ORAL | 4 refills | Status: DC

## 2022-05-11 MED ORDER — METFORMIN HCL 500 MG PO TABS: ORAL_TABLET | ORAL | 4 refills | Status: DC

## 2022-05-11 MED ORDER — HYDROCHLOROTHIAZIDE 25 MG PO TABS: 25.0000 mg | ORAL_TABLET | Freq: Every day | ORAL | 4 refills | Status: DC

## 2022-05-11 MED ORDER — ROSUVASTATIN CALCIUM 10 MG PO TABS: 10.0000 mg | ORAL_TABLET | Freq: Every day | ORAL | 4 refills | Status: DC

## 2022-05-11 MED ORDER — AMLODIPINE BESYLATE 5 MG PO TABS: 5.0000 mg | ORAL_TABLET | Freq: Every day | ORAL | 4 refills | Status: DC

## 2022-05-11 MED ORDER — NA SULFATE-K SULFATE-MG SULF 17.5-3.13-1.6 GM/177ML PO SOLN: 1.0000 | Freq: Once | ORAL | 0 refills | Status: AC

## 2022-05-11 NOTE — Progress Notes (Signed)
BP 137/84   Pulse 60   Temp 97.9 F (36.6 C) (Oral)   Ht _0  (1.626 m)   Wt 176 lb 9.6 oz (80.1 kg)   LMP  (LMP Unknown)   SpO2 98%   BMI 30.31 kg/m    Subjective:    Patient ID: Julia Wood, female    DOB: [birth year], 61 y.o.   MRN: [id number]  HPI: FREDDY SPADAFORA is a 61 y.o. female presenting on 05/11/2022 for comprehensive medical examination. Current medical complaints include:none  She currently lives with: son Menopausal Symptoms: no  DIABETES Last A1c 6.7% April.  Initially started on Metformin in February 2020.  Taking Metformin 1000 MG in the morning and 500 MG at night + Gabapentin 600 MG at bedtime for neuropathy pain.   Hypoglycemic episodes:no Polydipsia/polyuria: no Visual disturbance: no Chest pain: no Paresthesias: no Glucose Monitoring: yes             Accucheck frequency: every two weeks             Fasting glucose: <100 on checks             Post prandial:             Evening:             Before meals: Taking Insulin?: no             Long acting insulin:             Short acting insulin: Blood Pressure Monitoring: a few times a week Retinal Examination: Not up to Date -- [place] Exam: Up to Date Pneumovax: Not up to Date -- refuses Influenza: Not Up to Date -- refuses Aspirin: yes    HYPERTENSION / HYPERLIPIDEMIA Continues on HCTZ, Amlodipine, and Crestor. Satisfied with current treatment? yes Duration of hypertension: chronic BP monitoring frequency: on occasion BP range: on average 130/70 range BP medication side effects: no Duration of hyperlipidemia: chronic Cholesterol medication side effects: no Cholesterol supplements: none Medication compliance: good compliance Aspirin: yes Recent stressors: no Recurrent headaches: no Visual changes: no Palpitations: no Dyspnea: no Chest pain: no Lower extremity edema: no Dizzy/lightheaded: no   Depression Screen done today and results listed below:     11/28/2021   11:18  AM 01/09/2021    9:33 AM 10/09/2020    9:50 AM 09/22/2019   11:18 AM 08/18/2018   10:07 AM  Depression screen PHQ 2/9  Decreased Interest 0 0 0 0 0  Down, Depressed, Hopeless 0 0 0 0 0  PHQ - 2 Score 0 0 0 0 0  Altered sleeping 2 0 0  1  Tired, decreased energy 0 0 0  0  Change in appetite 1 0 0  1  Feeling bad or failure about yourself  0 0 0  0  Trouble concentrating 0 0 0  0  Moving slowly or fidgety/restless 0 0 0  0  Suicidal thoughts 0 0 0  0  PHQ-9 Score 3 0 0  2  Difficult doing work/chores Not difficult at all    Not difficult at all       09/29/2018    9:17 AM 08/22/2020    9:29 AM 01/09/2021    9:33 AM 11/28/2021   11:18 AM 05/11/2022   11:03 AM  Fall Risk  Falls in the past year?   0 0 0  Was there an injury with Fall?   0 0 0  Fall  Risk Category Calculator   0 0 0  Fall Risk Category   Low Low Low  Patient Fall Risk Level Low fall risk Low fall risk  Low fall risk Low fall risk  Patient at Risk for Falls Due to   No Fall Risks No Fall Risks No Fall Risks  Fall risk Follow up   Falls evaluation completed Falls evaluation completed Falls prevention discussed    Functional Status Survey: Is the patient deaf or have difficulty hearing?: No Does the patient have difficulty seeing, even when wearing glasses/contacts?: No Does the patient have difficulty concentrating, remembering, or making decisions?: No Does the patient have difficulty walking or climbing stairs?: No Does the patient have difficulty dressing or bathing?: No Does the patient have difficulty doing errands alone such as visiting a doctor's office or shopping?: No    Past Medical History:  Past Medical History:  Diagnosis Date   Diabetes mellitus without complication ([place])    Hyperlipidemia    Hypertension     Surgical History:  Past Surgical History:  Procedure Laterality Date   COLONOSCOPY WITH PROPOFOL N/A 09/29/2018   Procedure: COLONOSCOPY WITH PROPOFOL;  Surgeon: Virgel Manifold, MD;   Location: [facility] ENDOSCOPY;  Service: Endoscopy;  Laterality: N/A;   COLONOSCOPY WITH PROPOFOL N/A 08/22/2020   Procedure: COLONOSCOPY WITH PROPOFOL;  Surgeon: Virgel Manifold, MD;  Location: [facility] ENDOSCOPY;  Service: Endoscopy;  Laterality: N/A;   STOMACH SURGERY      Medications:  Current Outpatient Medications on File Prior to Visit  Medication Sig   Accu-Chek FastClix Lancets MISC USE AS DIRECTED UP TO FOUR TIMES  DAILY   ACCU-CHEK GUIDE test strip USE AS DIRECTED UP TO 4 TIMES DAILY   aspirin EC 81 MG tablet Take 81 mg by mouth daily.   blood glucose meter kit and supplies KIT Dispense based on patient and insurance preference. Use up to four times daily as directed. (FOR ICD-9 250.00, 250.01).   No current facility-administered medications on file prior to visit.    Allergies:  Allergies  Allergen Reactions   Losartan Cough    In 2019    Social History:  Social History   Socioeconomic History   Marital status: Single    Spouse name: Not on file   Number of children: Not on file   Years of education: Not on file   Highest education level: Not on file  Occupational History   Not on file  Tobacco Use   Smoking status: Never   Smokeless tobacco: Never  Vaping Use   Vaping Use: Never used  Substance and Sexual Activity   Alcohol use: Never   Drug use: Never   Sexual activity: Not Currently  Other Topics Concern   Not on file  Social History Narrative   Not on file   Social Determinants of Health   Financial Resource Strain: Low Risk  (01/09/2021)   Overall Financial Resource Strain (CARDIA)    Difficulty of Paying Living Expenses: Not hard at all  Food Insecurity: No Food Insecurity (01/09/2021)   Hunger Vital Sign    Worried About Running Out of Food in the Last Year: Never true    [place] in the Last Year: Never true  Transportation Needs: No Transportation Needs (01/09/2021)   PRAPARE - Hydrologist (Medical): No    Lack  of Transportation (Non-Medical): No  Physical Activity: Sufficiently Active (01/09/2021)   Exercise Vital Sign  Days of Exercise per Week: 5 days    Minutes of Exercise per Session: 50 min  Stress: No Stress Concern Present (01/09/2021)   [place]    Feeling of Stress : Only a little  Social Connections: Moderately Integrated (01/09/2021)   Social Connection and Isolation Panel [NHANES]    Frequency of Communication with Friends and Family: More than three times a week    Frequency of Social Gatherings with Friends and Family: More than three times a week    Attends Religious Services: More than 4 times per year    Active Member of [employer] or Organizations: Yes    Attends Archivist Meetings: Never    Marital Status: Never married  Intimate Partner Violence: Not At Risk (01/09/2021)   Humiliation, Afraid, Rape, and Kick questionnaire    Fear of Current or Ex-Partner: No    Emotionally Abused: No    Physically Abused: No    Sexually Abused: No   Social History   Tobacco Use  Smoking Status Never  Smokeless Tobacco Never   Social History   Substance and Sexual Activity  Alcohol Use Never    Family History:  Family History  Problem Relation Age of Onset   Hypertension Mother    Breast cancer Neg Hx     Past medical history, surgical history, medications, allergies, family history and social history reviewed with patient today and changes made to appropriate areas of the chart.   ROS All other ROS negative except what is listed above and in the HPI.      Objective:    BP 137/84   Pulse 60   Temp 97.9 F (36.6 C) (Oral)   Ht _0  (1.626 m)   Wt 176 lb 9.6 oz (80.1 kg)   LMP  (LMP Unknown)   SpO2 98%   BMI 30.31 kg/m   Wt Readings from Last 3 Encounters:  05/11/22 176 lb 9.6 oz (80.1 kg)  11/28/21 173 lb 9.6 oz (78.7 kg)  07/16/21 175 lb 3.2 oz (79.5 kg)    Physical Exam Vitals and  nursing note reviewed. Exam conducted with a chaperone present.  Constitutional:      General: She is awake. She is not in acute distress.    Appearance: She is well-developed and well-groomed. She is obese. She is not ill-appearing or toxic-appearing.  HENT:     Head: Normocephalic and atraumatic.     Right Ear: Hearing, tympanic membrane, ear canal and external ear normal. No drainage.     Left Ear: Hearing, tympanic membrane, ear canal and external ear normal. No drainage.     Nose: Nose normal.     Right Sinus: No maxillary sinus tenderness or frontal sinus tenderness.     Left Sinus: No maxillary sinus tenderness or frontal sinus tenderness.     Mouth/Throat:     Mouth: Mucous membranes are moist.     Pharynx: Oropharynx is clear. Uvula midline. No pharyngeal swelling, oropharyngeal exudate or posterior oropharyngeal erythema.  Eyes:     General: Lids are normal.        Right eye: No discharge.        Left eye: No discharge.     Extraocular Movements: Extraocular movements intact.     Conjunctiva/sclera: Conjunctivae normal.     Pupils: Pupils are equal, round, and reactive to light.     Visual Fields: Right eye visual fields normal and left eye visual fields  normal.  Neck:     Thyroid: No thyromegaly.     Vascular: No carotid bruit.     Trachea: Trachea normal.  Cardiovascular:     Rate and Rhythm: Normal rate and regular rhythm.     Heart sounds: Normal heart sounds. No murmur heard.    No gallop.  Pulmonary:     Effort: Pulmonary effort is normal. No accessory muscle usage or respiratory distress.     Breath sounds: Normal breath sounds.  Chest:  Breasts:    Right: Normal.     Left: Normal.  Abdominal:     General: Bowel sounds are normal.     Palpations: Abdomen is soft. There is no hepatomegaly or splenomegaly.     Tenderness: There is no abdominal tenderness.     Hernia: There is no hernia in the left inguinal area or right inguinal area.  Genitourinary:    Exam  position: Lithotomy position.     Labia:        Right: No rash.        Left: No rash.      Vagina: Normal.     Cervix: Normal.     Uterus: Normal.      Adnexa: Right adnexa normal and left adnexa normal.  Musculoskeletal:        General: Normal range of motion.     Cervical back: Normal range of motion and neck supple.     Right lower leg: No edema.     Left lower leg: No edema.  Lymphadenopathy:     Head:     Right side of head: No submental, submandibular, tonsillar, preauricular or posterior auricular adenopathy.     Left side of head: No submental, submandibular, tonsillar, preauricular or posterior auricular adenopathy.     Cervical: No cervical adenopathy.     Upper Body:     Right upper body: No supraclavicular, axillary or pectoral adenopathy.     Left upper body: No supraclavicular, axillary or pectoral adenopathy.  Skin:    General: Skin is warm and dry.     Capillary Refill: Capillary refill takes less than 2 seconds.     Findings: No rash.  Neurological:     Mental Status: She is alert and oriented to person, place, and time.     Gait: Gait is intact.     Deep Tendon Reflexes: Reflexes are normal and symmetric.     Reflex Scores:      Brachioradialis reflexes are 2+ on the right side and 2+ on the left side.      Patellar reflexes are 2+ on the right side and 2+ on the left side. Psychiatric:        Attention and Perception: Attention normal.        Mood and Affect: Mood normal.        Speech: Speech normal.        Behavior: Behavior normal. Behavior is cooperative.        Thought Content: Thought content normal.        Judgment: Judgment normal.    Diabetic Foot Exam - Simple   Simple Foot Form Visual Inspection No deformities, no ulcerations, no other skin breakdown bilaterally: Yes Sensation Testing Intact to touch and monofilament testing bilaterally: Yes Pulse Check Posterior Tibialis and Dorsalis pulse intact bilaterally: Yes Comments     Results  for orders placed or performed in visit on 05/11/22  Bayer DCA Hb A1c Waived  Result Value Ref Range  HB A1C (BAYER DCA - WAIVED) 6.8 (H) 4.8 - 5.6 %      Assessment & Plan:   Problem List Items Addressed This Visit       Cardiovascular and Mediastinum   Hypertension associated with diabetes ([place])    Chronic, stable with BP at goal in office today.  Urine ALB 30 November 2021 -- unable to take ACE or ARB due to history of cough.  Continue current regimen and adjust as needed.  Recommend she monitor BP at least a few mornings a week at home and document.  DASH diet at home.  LABS: A1c, CMP, CBC, TSH.  Return in 6 months.       Relevant Medications   hydrochlorothiazide (HYDRODIURIL) 25 MG tablet   amLODipine (NORVASC) 5 MG tablet   metFORMIN (GLUCOPHAGE) 500 MG tablet   rosuvastatin (CRESTOR) 10 MG tablet   Other Relevant Orders   Bayer DCA Hb A1c Waived (Completed)   CBC with Differential/Platelet   Comprehensive metabolic panel   TSH     Endocrine   Hyperlipidemia associated with type 2 diabetes mellitus ([facility])    Chronic, ongoing.  Continue current medication regimen and adjust as needed.  Lipid panel today.      Relevant Medications   hydrochlorothiazide (HYDRODIURIL) 25 MG tablet   amLODipine (NORVASC) 5 MG tablet   metFORMIN (GLUCOPHAGE) 500 MG tablet   rosuvastatin (CRESTOR) 10 MG tablet   Other Relevant Orders   Bayer DCA Hb A1c Waived (Completed)   Comprehensive metabolic panel   Lipid Panel w/o Chol/HDL Ratio   Type 2 diabetes mellitus with peripheral neuropathy ([facility])    Chronic, ongoing.  A1c today 6.8%, remaining below goal.  Continue Metformin 1000 MG in morning and 500 MG at night, no missing doses.  Continue checking BS at least fasting once weekly and document. 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  Refills sent in.  Return in 6 months.      Relevant Medications   gabapentin (NEURONTIN) 300 MG capsule    metFORMIN (GLUCOPHAGE) 500 MG tablet   rosuvastatin (CRESTOR) 10 MG tablet   Other Relevant Orders   Bayer DCA Hb A1c Waived (Completed)   Type 2 diabetes mellitus with proteinuria ([facility]) - Primary    Chronic, ongoing.  A1c 6.8% today. Urine ALB 30 November 2021 -- unable to take ACE or ARB due to history of cough.  Continue Metformin 1000 MG in morning and 500 MG at night, no missing doses.  Continue checking BS at least fasting once weekly and document. 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  Return in 6 months.      Relevant Medications   metFORMIN (GLUCOPHAGE) 500 MG tablet   rosuvastatin (CRESTOR) 10 MG tablet   Other Relevant Orders   Bayer DCA Hb A1c Waived (Completed)     Other   History of colonic polyps    Referral for repeat colonoscopy ordered today and discussed with patient at length.      Obesity    BMI 30.31.  Recommended eating smaller high protein, low fat meals more frequently and exercising 30 mins a day 5 times a week with a goal of 10-15lb weight loss in the next 3 months. Patient voiced their understanding and motivation to adhere to these recommendations.       Relevant Medications   metFORMIN (GLUCOPHAGE) 500 MG tablet   Other Visit  Diagnoses     Cervical cancer screening       Pap testing performed today and sent to lab.   Relevant Orders   Cytology - PAP   Encounter for screening mammogram for malignant neoplasm of breast       Mammogram ordered and patient given directions on how to obtain.   Relevant Orders   MM 3D SCREEN BREAST BILATERAL   Colon cancer screening       GI referral in place   Relevant Orders   Ambulatory referral to Gastroenterology   Encounter for annual physical exam       Annual physical today with labs and health maintenance reviewed, discussed with patient.        Follow up plan: Return in about 6 months (around 11/10/2022) for T2DM, HTN/HLD.   LABORATORY TESTING:  - Pap  smear: pap done  IMMUNIZATIONS:   - Tdap: Tetanus vaccination status reviewed: refuses. - Influenza: Refused - Pneumovax: Refused - Prevnar: Refused - COVID: Up to date - HPV: Not applicable - Shingrix vaccine: Refused  SCREENING: -Mammogram: Ordered today  - Colonoscopy: Ordered today  - Bone Density: Not applicable  -Hearing Test: Not applicable  -Spirometry: Not applicable   PATIENT COUNSELING:   Advised to take 1 mg of folate supplement per day if capable of pregnancy.   Sexuality: Discussed sexually transmitted diseases, partner selection, use of condoms, avoidance of unintended pregnancy  and contraceptive alternatives.   Advised to avoid cigarette smoking.  I discussed with the patient that most people either abstain from alcohol or drink within safe limits (<=14/week and <=4 drinks/occasion for males, <=7/weeks and <= 3 drinks/occasion for females) and that the risk for alcohol disorders and other health effects rises proportionally with the number of drinks per week and how often a drinker exceeds daily limits.  Discussed cessation/primary prevention of drug use and availability of treatment for abuse.   Diet: Encouraged to adjust caloric intake to maintain  or achieve ideal body weight, to reduce intake of dietary saturated fat and total fat, to limit sodium intake by avoiding high sodium foods and not adding table salt, and to maintain adequate dietary potassium and calcium preferably from fresh fruits, vegetables, and low-fat dairy products.    stressed the importance of regular exercise  Injury prevention: Discussed safety belts, safety helmets, smoke detector, smoking near bedding or upholstery.   Dental health: Discussed importance of regular tooth brushing, flossing, and dental visits.    NEXT PREVENTATIVE PHYSICAL DUE IN 1 YEAR. Return in about 6 months (around 11/10/2022) for T2DM, HTN/HLD.

## 2022-05-11 NOTE — Assessment & Plan Note (Signed)
Chronic, ongoing.  Continue current medication regimen and adjust as needed. Lipid panel today. 

## 2022-05-11 NOTE — Assessment & Plan Note (Signed)
BMI 30.31.  Recommended eating smaller high protein, low fat meals more frequently and exercising 30 mins a day 5 times a week with a goal of 10-15lb weight loss in the next 3 months. Patient voiced their understanding and motivation to adhere to these recommendations.

## 2022-05-11 NOTE — Assessment & Plan Note (Signed)
Referral for repeat colonoscopy ordered today and discussed with patient at length.

## 2022-05-11 NOTE — Assessment & Plan Note (Addendum)
Chronic, stable with BP at goal in office today.  Urine ALB 30 November 2021 -- unable to take ACE or ARB due to history of cough.  Continue current regimen and adjust as needed.  Recommend she monitor BP at least a few mornings a week at home and document.  DASH diet at home.  LABS: A1c, CMP, CBC, TSH.  Return in 6 months.

## 2022-05-11 NOTE — Telephone Encounter (Signed)
Gastroenterology Pre-Procedure Review  Request Date: 07/02/22 Requesting Physician: Dr. Marius Ditch  PATIENT REVIEW QUESTIONS: The patient responded to the following health history questions as indicated:    1. Are you having any GI issues? no 2. Do you have a personal history of Polyps? yes (08/22/20 polyps were noted on colonoscopy performed by Dr. Bonna Gains) 3. Do you have a family history of Colon Cancer or Polyps? no 4. Diabetes Mellitus? yes (Patient has been advised to hold Metformin 2 days prior to colonoscopy) 5. Joint replacements in the past 12 months?no 6. Major health problems in the past 3 months?no 7. Any artificial heart valves, MVP, or defibrillator?no    MEDICATIONS & ALLERGIES:    Patient reports the following regarding taking any anticoagulation/antiplatelet therapy:   Plavix, Coumadin, Eliquis, Xarelto, Lovenox, Pradaxa, Brilinta, or Effient? no Aspirin? Yes 33m daily  Patient confirms/reports the following medications:  Current Outpatient Medications  Medication Sig Dispense Refill   Accu-Chek FastClix Lancets MISC USE AS DIRECTED UP TO FOUR TIMES  DAILY 98 each 4   ACCU-CHEK GUIDE test strip USE AS DIRECTED UP TO 4 TIMES DAILY 200 each 4   amLODipine (NORVASC) 5 MG tablet Take 1 tablet (5 mg total) by mouth daily. 90 tablet 4   aspirin EC 81 MG tablet Take 81 mg by mouth daily.     blood glucose meter kit and supplies KIT Dispense based on patient and insurance preference. Use up to four times daily as directed. (FOR ICD-9 250.00, 250.01). 1 each 0   gabapentin (NEURONTIN) 300 MG capsule Take 2 capsules (600 mg total) by mouth at bedtime. Take this around 3 am when you go to bed. 180 capsule 4   hydrochlorothiazide (HYDRODIURIL) 25 MG tablet Take 1 tablet (25 mg total) by mouth daily. 90 tablet 4   metFORMIN (GLUCOPHAGE) 500 MG tablet Take 2 tablets (1000 MG) in the morning by mouth and 1 tablet (500 MG) in the evening by mouth. 270 tablet 4   rosuvastatin (CRESTOR)  10 MG tablet Take 1 tablet (10 mg total) by mouth daily. 90 tablet 4   No current facility-administered medications for this visit.    Patient confirms/reports the following allergies:  Allergies  Allergen Reactions   Losartan Cough    In 2019    No orders of the defined types were placed in this encounter.   AUTHORIZATION INFORMATION Primary Insurance: 1D#: Group #:  Secondary Insurance: 1D#: Group #:  SCHEDULE INFORMATION: Date: 07/02/22 Time: Location: [facility]

## 2022-05-11 NOTE — Assessment & Plan Note (Addendum)
Chronic, ongoing.  A1c 6.8% today. Urine ALB 30 November 2021 -- unable to take ACE or ARB due to history of cough.  Continue Metformin 1000 MG in morning and 500 MG at night, no missing doses.  Continue checking BS at least fasting once weekly and document. 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  Return in 6 months.

## 2022-05-11 NOTE — Assessment & Plan Note (Signed)
Chronic, ongoing.  A1c today 6.8%, remaining below goal.  Continue Metformin 1000 MG in morning and 500 MG at night, no missing doses.  Continue checking BS at least fasting once weekly and document. 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  Refills sent in.  Return in 6 months.

## 2022-05-12 LAB — COMPREHENSIVE METABOLIC PANEL
ALT: 18 IU/L (ref 0–32)
AST: 18 IU/L (ref 0–40)
Albumin/Globulin Ratio: 1.9 (ref 1.2–2.2)
Albumin: 4 g/dL (ref 3.9–4.9)
Alkaline Phosphatase: 71 IU/L (ref 44–121)
BUN/Creatinine Ratio: 10 — ABNORMAL LOW (ref 12–28)
BUN: 8 mg/dL (ref 8–27)
Bilirubin Total: <0.2 mg/dL (ref 0.0–1.2)
CO2: 26 mmol/L (ref 20–29)
Calcium: 9.5 mg/dL (ref 8.7–10.3)
Chloride: 100 mmol/L (ref 96–106)
Creatinine, Ser: 0.78 mg/dL (ref 0.57–1.00)
Globulin, Total: 2.1 g/dL (ref 1.5–4.5)
Glucose: 117 mg/dL — ABNORMAL HIGH (ref 70–99)
Potassium: 3.6 mmol/L (ref 3.5–5.2)
Sodium: 143 mmol/L (ref 134–144)
Total Protein: 6.1 g/dL (ref 6.0–8.5)
eGFR: 86 mL/min/{1.73_m2} (ref >59)

## 2022-05-12 LAB — TSH: TSH: 2.82 u[IU]/mL (ref 0.450–4.500)

## 2022-05-12 LAB — CBC WITH DIFFERENTIAL/PLATELET
Basophils Absolute: 0.1 10*3/uL (ref 0.0–0.2)
Basos: 1 %
EOS (ABSOLUTE): 0 10*3/uL (ref 0.0–0.4)
Eos: 0 %
Hematocrit: 39.4 % (ref 34.0–46.6)
Hemoglobin: 12.9 g/dL (ref 11.1–15.9)
Immature Grans (Abs): 0 10*3/uL (ref 0.0–0.1)
Immature Granulocytes: 0 %
Lymphocytes Absolute: 2.7 10*3/uL (ref 0.7–3.1)
Lymphs: 36 %
MCH: 26.5 pg — ABNORMAL LOW (ref 26.6–33.0)
MCHC: 32.7 g/dL (ref 31.5–35.7)
MCV: 81 fL (ref 79–97)
Monocytes Absolute: 0.3 10*3/uL (ref 0.1–0.9)
Monocytes: 4 %
Neutrophils Absolute: 4.4 10*3/uL (ref 1.4–7.0)
Neutrophils: 59 %
Platelets: 331 10*3/uL (ref 150–450)
RBC: 4.86 x10E6/uL (ref 3.77–5.28)
RDW: 13.9 % (ref 11.7–15.4)
WBC: 7.6 10*3/uL (ref 3.4–10.8)

## 2022-05-12 LAB — LIPID PANEL W/O CHOL/HDL RATIO
Cholesterol, Total: 107 mg/dL (ref 100–199)
HDL: 33 mg/dL — ABNORMAL LOW (ref >39)
LDL Chol Calc (NIH): 50 mg/dL (ref 0–99)
Triglycerides: 136 mg/dL (ref 0–149)
VLDL Cholesterol Cal: 24 mg/dL (ref 5–40)

## 2022-05-12 NOTE — Progress Notes (Signed)
Good afternoon, please let Julia Wood know her labs have returned and overall they look fantastic, including kidney and liver function.  Cholesterol levels are at goal.  Continue all current medications.  Great job!! Keep being amazing!!  Thank you for allowing me to participate in your care.  I appreciate you. Kindest regards, Desare Duddy

## 2022-07-02 ENCOUNTER — Encounter: Admission: RE | Payer: Self-pay | Source: Home / Self Care

## 2022-07-02 ENCOUNTER — Ambulatory Visit: Admission: RE | Admit: 2022-07-02 | Payer: 59 | Source: Home / Self Care | Admitting: Gastroenterology

## 2022-07-02 SURGERY — COLONOSCOPY WITH PROPOFOL
Anesthesia: General

## 2022-11-08 NOTE — Patient Instructions (Incomplete)
Ozempic and Trulicity  Be Involved in Your Health Care:  Taking Medications When medications are taken as directed, they can greatly improve your health. But if they are not taken as instructed, they may not work. In some cases, not taking them correctly can be harmful. To help ensure your treatment remains effective and safe, understand your medications and how to take them.  Your lab results, notes and after visit summary will be available on My Chart. We strongly encourage you to use this feature. If lab results are abnormal the clinic will contact you with the appropriate steps. If the clinic does not contact you assume the results are satisfactory. You can always see your results on My Chart. If you have questions regarding your condition, please contact the clinic during office hours. You can also ask questions on My Chart.  We at [facility] are grateful that you chose [place] to provide care. We strive to provide excellent and compassionate care and are always looking for feedback. If you get a survey from the clinic please complete this.    Diabetes Mellitus Basics  Diabetes mellitus, or diabetes, is a long-term (chronic) disease. It occurs when the body does not properly use sugar (glucose) that is released from food after you eat. Diabetes mellitus may be caused by one or both of these problems: Your pancreas does not make enough of a hormone called insulin. Your body does not react in a normal way to the insulin that it makes. Insulin lets glucose enter cells in your body. This gives you energy. If you have diabetes, glucose cannot get into cells. This causes high blood glucose (hyperglycemia). How to treat and manage diabetes You may need to take insulin or other diabetes medicines daily to keep your glucose in balance. If you are prescribed insulin, you will learn how to give yourself insulin by injection. You may need to adjust the amount of insulin you take based on the  foods that you eat. You will need to check your blood glucose levels using a glucose monitor as told by your health care provider. The readings can help determine if you have low or high blood glucose. Generally, you should have these blood glucose levels: Before meals (preprandial): 80-130 mg/dL (9.3-7.1 mmol/L). After meals (postprandial): below 180 mg/dL (10 mmol/L). Hemoglobin A1c (HbA1c) level: less than 7%. Your health care provider will set treatment goals for you. Keep all follow-up visits. This is important. Follow these instructions at home: Diabetes medicines Take your diabetes medicines every day as told by your health care provider. List your diabetes medicines here: Name of medicine: ______________________________ Amount (dose): _______________ Time (a.m./p.m.): _______________ Notes: ___________________________________ Name of medicine: ______________________________ Amount (dose): _______________ Time (a.m./p.m.): _______________ Notes: ___________________________________ Name of medicine: ______________________________ Amount (dose): _______________ Time (a.m./p.m.): _______________ Notes: ___________________________________ Insulin If you use insulin, list the types of insulin you use here: Insulin type: ______________________________ Amount (dose): _______________ Time (a.m./p.m.): _______________Notes: ___________________________________ Insulin type: ______________________________ Amount (dose): _______________ Time (a.m./p.m.): _______________ Notes: ___________________________________ Insulin type: ______________________________ Amount (dose): _______________ Time (a.m./p.m.): _______________ Notes: ___________________________________ Insulin type: ______________________________ Amount (dose): _______________ Time (a.m./p.m.): _______________ Notes: ___________________________________ Insulin type: ______________________________ Amount (dose): _______________ Time  (a.m./p.m.): _______________ Notes: ___________________________________ Managing blood glucose  Check your blood glucose levels using a glucose monitor as told by your health care provider. Write down the times that you check your glucose levels here: Time: _______________ Notes: ___________________________________ Time: _______________ Notes: ___________________________________ Time: _______________ Notes: ___________________________________ Time: _______________ Notes: ___________________________________ Time: _______________ Notes:  ___________________________________ Time: _______________ Notes: ___________________________________  Low blood glucose Low blood glucose (hypoglycemia) is when glucose is at or below 70 mg/dL (3.9 mmol/L). Symptoms may include: Feeling: Hungry. Sweaty and clammy. Irritable or easily upset. Dizzy. Sleepy. Having: A fast heartbeat. A headache. A change in your vision. Numbness around the mouth, lips, or tongue. Having trouble with: Moving (coordination). Sleeping. Treating low blood glucose To treat low blood glucose, eat or drink something containing sugar right away. If you can think clearly and swallow safely, follow the 15:15 rule: Take 15 grams of a fast-acting carb (carbohydrate), as told by your health care provider. Some fast-acting carbs are: Glucose tablets: take 3-4 tablets. Hard candy: eat 3-5 pieces. Fruit juice: drink 4 oz (120 mL). Regular (not diet) soda: drink 4-6 oz (120-180 mL). Honey or sugar: eat 1 Tbsp (15 mL). Check your blood glucose levels 15 minutes after you take the carb. If your glucose is still at or below 70 mg/dL (3.9 mmol/L), take 15 grams of a carb again. If your glucose does not go above 70 mg/dL (3.9 mmol/L) after 3 tries, get help right away. After your glucose goes back to normal, eat a meal or a snack within 1 hour. Treating very low blood glucose If your glucose is at or below 54 mg/dL (3 mmol/L), you have  very low blood glucose (severe hypoglycemia). This is an emergency. Do not wait to see if the symptoms will go away. Get medical help right away. Call your local emergency services (911 in the [place] Do not drive yourself to the hospital. Questions to ask your health care provider Should I talk with a diabetes educator? What equipment will I need to care for myself at home? What diabetes medicines do I need? When should I take them? How often do I need to check my blood glucose levels? What number can I call if I have questions? When is my follow-up visit? Where can I find a support group for people with diabetes? Where to find more information American Diabetes Association: www.diabetes.org Association of Diabetes Care and Education Specialists: www.diabeteseducator.org Contact a health care provider if: Your blood glucose is at or above 240 mg/dL (36.1 mmol/L) for 2 days in a row. You have been sick or have had a fever for 2 days or more, and you are not getting better. You have any of these problems for more than 6 hours: You cannot eat or drink. You feel nauseous. You vomit. You have diarrhea. Get help right away if: Your blood glucose is lower than 54 mg/dL (3 mmol/L). You get confused. You have trouble thinking clearly. You have trouble breathing. These symptoms may represent a serious problem that is an emergency. Do not wait to see if the symptoms will go away. Get medical help right away. Call your local emergency services (911 in the [place] Do not drive yourself to the hospital. Summary Diabetes mellitus is a chronic disease that occurs when the body does not properly use sugar (glucose) that is released from food after you eat. Take insulin and diabetes medicines as told. Check your blood glucose every day, as often as told. Keep all follow-up visits. This is important. This information is not intended to replace advice given to you by your health care provider. Make sure  you discuss any questions you have with your health care provider. Document Revised: 11/21/2019 Document Reviewed: 11/21/2019 Elsevier Patient Education  2023 [employer].

## 2022-11-10 ENCOUNTER — Ambulatory Visit: Payer: 59 | Admitting: Nurse Practitioner

## 2022-11-10 ENCOUNTER — Encounter: Payer: Self-pay | Admitting: Nurse Practitioner

## 2022-11-10 VITALS — BP 136/72 | HR 71 | Temp 98.1°F | Ht 64.02 in | Wt 182.0 lb

## 2022-11-10 DIAGNOSIS — Z6830 Body mass index (BMI) 30.0-30.9, adult: Secondary | ICD-10-CM

## 2022-11-10 DIAGNOSIS — E66811 Obesity, class 1: Secondary | ICD-10-CM

## 2022-11-10 DIAGNOSIS — E1129 Type 2 diabetes mellitus with other diabetic kidney complication: Secondary | ICD-10-CM | POA: Diagnosis not present

## 2022-11-10 DIAGNOSIS — R809 Proteinuria, unspecified: Secondary | ICD-10-CM | POA: Diagnosis not present

## 2022-11-10 DIAGNOSIS — E1169 Type 2 diabetes mellitus with other specified complication: Secondary | ICD-10-CM | POA: Diagnosis not present

## 2022-11-10 DIAGNOSIS — Z860100 Personal history of colon polyps, unspecified: Secondary | ICD-10-CM

## 2022-11-10 DIAGNOSIS — I152 Hypertension secondary to endocrine disorders: Secondary | ICD-10-CM | POA: Diagnosis not present

## 2022-11-10 DIAGNOSIS — E785 Hyperlipidemia, unspecified: Secondary | ICD-10-CM

## 2022-11-10 DIAGNOSIS — E1159 Type 2 diabetes mellitus with other circulatory complications: Secondary | ICD-10-CM

## 2022-11-10 DIAGNOSIS — E6609 Other obesity due to excess calories: Secondary | ICD-10-CM

## 2022-11-10 DIAGNOSIS — E1142 Type 2 diabetes mellitus with diabetic polyneuropathy: Secondary | ICD-10-CM | POA: Diagnosis not present

## 2022-11-10 DIAGNOSIS — Z23 Encounter for immunization: Secondary | ICD-10-CM

## 2022-11-10 LAB — MICROALBUMIN, URINE WAIVED
Creatinine, Urine Waived: 50 mg/dL (ref 10–300)
Microalb, Ur Waived: 30 mg/L — ABNORMAL HIGH (ref 0–19)

## 2022-11-10 LAB — BAYER DCA HB A1C WAIVED: HB A1C (BAYER DCA - WAIVED): 7.8 % — ABNORMAL HIGH (ref 4.8–5.6)

## 2022-11-10 NOTE — Assessment & Plan Note (Signed)
Chronic, ongoing.  Continue current medication regimen and adjust as needed. Lipid panel today. 

## 2022-11-10 NOTE — Assessment & Plan Note (Signed)
BMI 31.22.  Recommended eating smaller high protein, low fat meals more frequently and exercising 30 mins a day 5 times a week with a goal of 10-15lb weight loss in the next 3 months. Patient voiced their understanding and motivation to adhere to these recommendations.  

## 2022-11-10 NOTE — Assessment & Plan Note (Addendum)
Chronic, ongoing.  A1c 7.8% today, trend up due to poor diet choices. Urine ALB 01 December 2022 -- unable to take ACE or ARB due to history of cough.  Continue Metformin 1000 MG in morning and 500 MG at night, no missing doses.  Continue checking BS at least fasting once weekly and document. 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  Return in 3 months. - Can not take ACE or ARB - Statin on board - Needs to schedule eye exam - Foot exam up to date - Refuses multiple vaccinations - Discussed option of GLP1 to help avoid missed medication doses and help with weight loss -- she is fearful of needles.

## 2022-11-10 NOTE — Progress Notes (Signed)
BP 136/72 (BP Location: Left Arm, Patient Position: Sitting, Cuff Size: Normal)   Pulse 71   Temp 98.1 F (36.7 C) (Oral)   Ht 5' 4.02" (1.626 m)   Wt 182 lb (82.6 kg)   LMP  (LMP Unknown)   SpO2 98%   BMI 31.22 kg/m    Subjective:    Patient ID: Julia Wood, female    DOB: 1961-03-06, 62 y.o.   MRN: [id number]  HPI: Julia Wood is a 62 y.o. female  Chief Complaint  Patient presents with   Diabetes   Hypertension   Hyperlipidemia   DIABETES A1c 6.8% October.  Initially started on Metformin in February 2020.  Taking Metformin 1000 MG in the morning and 500 MG at night (does miss doses about twice a week) + Gabapentin 600 MG at bedtime for neuropathy pain.  She endorses poor diet choices over past months.   Hypoglycemic episodes:no Polydipsia/polyuria: no Visual disturbance: no Chest pain: no Paresthesias: no Glucose Monitoring: yes             Accucheck frequency: every two weeks             Fasting glucose: 90 to 130 range             Post prandial:             Evening:             Before meals: Taking Insulin?: no             Long acting insulin:             Short acting insulin: Blood Pressure Monitoring: a few times a week Retinal Examination: Not up to [facility] Exam: Up to Date Pneumovax: Not up to Date -- refuses Influenza: Not Up to Date -- refuses Aspirin: yes    HYPERTENSION / HYPERLIPIDEMIA Continues on HCTZ, Amlodipine, and Crestor.  Has history of colon polyps, was scheduled for colonoscopy but insurance told her day of procedure they would not pay for it -- so cancelled. Satisfied with current treatment? yes Duration of hypertension: chronic BP monitoring frequency: on occasion BP range: on average 130/80 range BP medication side effects: no Duration of hyperlipidemia: chronic Cholesterol medication side effects: no Cholesterol supplements: none Medication compliance: good compliance Aspirin: yes Recent stressors:  no Recurrent headaches: no Visual changes: no Palpitations: no Dyspnea: no Chest pain: no Lower extremity edema: no Dizzy/lightheaded: no  The ASCVD Risk score (Arnett DK, et al., 2019) failed to calculate for the following reasons:   The valid total cholesterol range is 130 to 320 mg/dL   Relevant past medical, surgical, family and social history reviewed and updated as indicated. Interim medical history since our last visit reviewed. Allergies and medications reviewed and updated.  Review of Systems  Constitutional:  Negative for activity change, appetite change, diaphoresis, fatigue and fever.  Respiratory:  Negative for cough, chest tightness and shortness of breath.   Cardiovascular:  Negative for chest pain, palpitations and leg swelling.  Gastrointestinal: Negative.   Endocrine: Negative for cold intolerance, heat intolerance, polydipsia, polyphagia and polyuria.  Neurological: Negative.   Psychiatric/Behavioral: Negative.      Per HPI unless specifically indicated above     Objective:    BP 136/72 (BP Location: Left Arm, Patient Position: Sitting, Cuff Size: Normal)   Pulse 71   Temp 98.1 F (36.7 C) (Oral)   Ht 5' 4.02" (1.626 m)   Wt 182  lb (82.6 kg)   LMP  (LMP Unknown)   SpO2 98%   BMI 31.22 kg/m   Wt Readings from Last 3 Encounters:  11/10/22 182 lb (82.6 kg)  05/11/22 176 lb 9.6 oz (80.1 kg)  11/28/21 173 lb 9.6 oz (78.7 kg)    Physical Exam Vitals and nursing note reviewed.  Constitutional:      General: She is awake. She is not in acute distress.    Appearance: She is well-developed and well-groomed. She is obese. She is not ill-appearing.  HENT:     Head: Normocephalic.     Right Ear: Hearing normal.     Left Ear: Hearing normal.  Eyes:     General: Lids are normal.        Right eye: No discharge.        Left eye: No discharge.     Conjunctiva/sclera: Conjunctivae normal.     Pupils: Pupils are equal, round, and reactive to light.  Neck:      Thyroid: No thyromegaly.     Vascular: No carotid bruit.  Cardiovascular:     Rate and Rhythm: Normal rate and regular rhythm.     Heart sounds: Normal heart sounds. No murmur heard.    No gallop.  Pulmonary:     Effort: Pulmonary effort is normal. No accessory muscle usage or respiratory distress.     Breath sounds: Normal breath sounds.  Abdominal:     General: Bowel sounds are normal.     Palpations: Abdomen is soft.  Musculoskeletal:     Cervical back: Normal range of motion and neck supple.     Right lower leg: No edema.     Left lower leg: No edema.  Skin:    General: Skin is warm and dry.  Neurological:     Mental Status: She is alert and oriented to person, place, and time.  Psychiatric:        Attention and Perception: Attention normal.        Mood and Affect: Mood normal.        Behavior: Behavior normal. Behavior is cooperative.        Thought Content: Thought content normal.        Judgment: Judgment normal.    Results for orders placed or performed in visit on 05/11/22  Bayer DCA Hb A1c Waived  Result Value Ref Range   HB A1C (BAYER DCA - WAIVED) 6.8 (H) 4.8 - 5.6 %  CBC with Differential/Platelet  Result Value Ref Range   WBC 7.6 3.4 - 10.8 x10E3/uL   RBC 4.86 3.77 - 5.28 x10E6/uL   Hemoglobin 12.9 11.1 - 15.9 g/dL   Hematocrit 16.1 09.6 - 46.6 %   MCV 81 79 - 97 fL   MCH 26.5 (L) 26.6 - 33.0 pg   MCHC 32.7 31.5 - 35.7 g/dL   RDW 04.5 40.9 - 81.1 %   Platelets 331 150 - 450 x10E3/uL   Neutrophils 59 Not Estab. %   Lymphs 36 Not Estab. %   Monocytes 4 Not Estab. %   Eos 0 Not Estab. %   Basos 1 Not Estab. %   Neutrophils Absolute 4.4 1.4 - 7.0 x10E3/uL   Lymphocytes Absolute 2.7 0.7 - 3.1 x10E3/uL   Monocytes Absolute 0.3 0.1 - 0.9 x10E3/uL   EOS (ABSOLUTE) 0.0 0.0 - 0.4 x10E3/uL   Basophils Absolute 0.1 0.0 - 0.2 x10E3/uL   Immature Granulocytes 0 Not Estab. %   Immature Grans (Abs) 0.0 0.0 -  0.1 x10E3/uL  Comprehensive metabolic panel  Result  Value Ref Range   Glucose 117 (H) 70 - 99 mg/dL   BUN 8 8 - 27 mg/dL   Creatinine, Ser 1.610.78 0.57 - 1.00 mg/dL   eGFR 86 >09>59 UE/AVW/0.98mL/min/1.73   BUN/Creatinine Ratio 10 (L) 12 - 28   Sodium 143 134 - 144 mmol/L   Potassium 3.6 3.5 - 5.2 mmol/L   Chloride 100 96 - 106 mmol/L   CO2 26 20 - 29 mmol/L   Calcium 9.5 8.7 - 10.3 mg/dL   Total Protein 6.1 6.0 - 8.5 g/dL   Albumin 4.0 3.9 - 4.9 g/dL   Globulin, Total 2.1 1.5 - 4.5 g/dL   Albumin/Globulin Ratio 1.9 1.2 - 2.2   Bilirubin Total <0.2 0.0 - 1.2 mg/dL   Alkaline Phosphatase 71 44 - 121 IU/L   AST 18 0 - 40 IU/L   ALT 18 0 - 32 IU/L  Lipid Panel w/o Chol/HDL Ratio  Result Value Ref Range   Cholesterol, Total 107 100 - 199 mg/dL   Triglycerides 119136 0 - 149 mg/dL   HDL 33 (L) >14>39 mg/dL   VLDL Cholesterol Cal 24 5 - 40 mg/dL   LDL Chol Calc (NIH) 50 0 - 99 mg/dL  TSH  Result Value Ref Range   TSH 2.820 0.450 - 4.500 uIU/mL      Assessment & Plan:   Problem List Items Addressed This Visit       Cardiovascular and Mediastinum   Hypertension associated with diabetes    Chronic, stable.  BP at goal on recheck today.  Urine ALB 01 December 2022 -- unable to take ACE or ARB due to history of cough.  Continue current regimen and adjust as needed.  Recommend she monitor BP at least a few mornings a week at home and document.  DASH diet at home.  LABS: CMP and Urine ALB.  Return in 3 months.       Relevant Orders   Bayer DCA Hb A1c Waived   Microalbumin, Urine Waived   Comprehensive metabolic panel     Endocrine   Hyperlipidemia associated with type 2 diabetes mellitus    Chronic, ongoing.  Continue current medication regimen and adjust as needed.  Lipid panel today.      Relevant Orders   Bayer DCA Hb A1c Waived   Comprehensive metabolic panel   Lipid Panel w/o Chol/HDL Ratio   Type 2 diabetes mellitus with peripheral neuropathy    Chronic, ongoing.  A1c today 7.8%, trending up due to poor diet choices past few months.  Continue  Metformin 1000 MG in morning and 500 MG at night, no missing doses.  Continue checking BS at least fasting once weekly and document. 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  Return in 3 months. - Can not take ACE or ARB - Statin on board - Needs to schedule eye exam - Foot exam up to date - Refuses multiple vaccinations - Discussed option of GLP1 to help avoid missed medication doses and help with weight loss -- she is fearful of needles.      Relevant Orders   Bayer DCA Hb A1c Waived   Microalbumin, Urine Waived   Comprehensive metabolic panel   Vitamin B12   Type 2 diabetes mellitus with proteinuria - Primary    Chronic, ongoing.  A1c 7.8% today, trend up due to poor diet choices. Urine ALB 01 December 2022 --  unable to take ACE or ARB due to history of cough.  Continue Metformin 1000 MG in morning and 500 MG at night, no missing doses.  Continue checking BS at least fasting once weekly and document. 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  Return in 3 months. - Can not take ACE or ARB - Statin on board - Needs to schedule eye exam - Foot exam up to date - Refuses multiple vaccinations - Discussed option of GLP1 to help avoid missed medication doses and help with weight loss -- she is fearful of needles.      Relevant Orders   Bayer DCA Hb A1c Waived   Microalbumin, Urine Waived   Comprehensive metabolic panel     Other   History of colonic polyps    Scheduled for colonoscopy, but day of found out it would not be covered by insurance, although GI had advised one year recheck -- have instructed her to reach out to insurance on this and ensure she is able to obtain recheck.      Obesity    BMI 31.22.  Recommended eating smaller high protein, low fat meals more frequently and exercising 30 mins a day 5 times a week with a goal of 10-15lb weight loss in the next 3 months. Patient voiced their  understanding and motivation to adhere to these recommendations.       Other Visit Diagnoses     Need for Td vaccine       Td vaccine in office today, educated her on this.   Relevant Orders   Td vaccine greater than or equal to 7yo preservative free IM        Follow up plan: Return in about 3 months (around 02/09/2023) for T2DM, HTN/HLD.

## 2022-11-10 NOTE — Assessment & Plan Note (Signed)
Scheduled for colonoscopy, but day of found out it would not be covered by insurance, although GI had advised one year recheck -- have instructed her to reach out to insurance on this and ensure she is able to obtain recheck.

## 2022-11-10 NOTE — Assessment & Plan Note (Signed)
Chronic, ongoing.  A1c today 7.8%, trending up due to poor diet choices past few months.  Continue Metformin 1000 MG in morning and 500 MG at night, no missing doses.  Continue checking BS at least fasting once weekly and document. 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  Return in 3 months. - Can not take ACE or ARB - Statin on board - Needs to schedule eye exam - Foot exam up to date - Refuses multiple vaccinations - Discussed option of GLP1 to help avoid missed medication doses and help with weight loss -- she is fearful of needles.

## 2022-11-10 NOTE — Assessment & Plan Note (Signed)
Chronic, stable.  BP at goal on recheck today.  Urine ALB 01 December 2022 -- unable to take ACE or ARB due to history of cough.  Continue current regimen and adjust as needed.  Recommend she monitor BP at least a few mornings a week at home and document.  DASH diet at home.  LABS: CMP and Urine ALB.  Return in 3 months.

## 2022-11-11 LAB — COMPREHENSIVE METABOLIC PANEL
ALT: 19 IU/L (ref 0–32)
AST: 25 IU/L (ref 0–40)
Albumin/Globulin Ratio: 1.9 (ref 1.2–2.2)
Albumin: 3.8 g/dL — ABNORMAL LOW (ref 3.9–4.9)
Alkaline Phosphatase: 64 IU/L (ref 44–121)
BUN/Creatinine Ratio: 15 (ref 12–28)
BUN: 13 mg/dL (ref 8–27)
Bilirubin Total: <0.2 mg/dL (ref 0.0–1.2)
CO2: 23 mmol/L (ref 20–29)
Calcium: 8.9 mg/dL (ref 8.7–10.3)
Chloride: 105 mmol/L (ref 96–106)
Creatinine, Ser: 0.85 mg/dL (ref 0.57–1.00)
Globulin, Total: 2 g/dL (ref 1.5–4.5)
Glucose: 144 mg/dL — ABNORMAL HIGH (ref 70–99)
Potassium: 3.9 mmol/L (ref 3.5–5.2)
Sodium: 141 mmol/L (ref 134–144)
Total Protein: 5.8 g/dL — ABNORMAL LOW (ref 6.0–8.5)
eGFR: 77 mL/min/{1.73_m2} (ref >59)

## 2022-11-11 LAB — VITAMIN B12: Vitamin B-12: 468 pg/mL (ref 232–1245)

## 2022-11-11 LAB — LIPID PANEL W/O CHOL/HDL RATIO
Cholesterol, Total: 114 mg/dL (ref 100–199)
HDL: 28 mg/dL — ABNORMAL LOW (ref >39)
LDL Chol Calc (NIH): 46 mg/dL (ref 0–99)
Triglycerides: 257 mg/dL — ABNORMAL HIGH (ref 0–149)
VLDL Cholesterol Cal: 40 mg/dL (ref 5–40)

## 2022-11-11 NOTE — Progress Notes (Signed)
Good morning, please let Julia Wood know her labs have returned: - Kidney function remains stable, as is liver function. - Cholesterol levels continue to show at goal LDL, bad cholesterol, but triglycerides are a little elevated -- continue all current medications and focus on diet changes as we discussed. - B12 level is on lower side of normal, please ensure you are taking Vitamin B12 1000 MCG daily which you can get over the counter.  This is good for nervous system health.  Any questions? Keep being amazing!!  Thank you for allowing me to participate in your care.  I appreciate you. Kindest regards, Letanya Froh

## 2023-02-09 ENCOUNTER — Ambulatory Visit: Payer: 59 | Admitting: Nurse Practitioner

## 2023-02-09 DIAGNOSIS — E1159 Type 2 diabetes mellitus with other circulatory complications: Secondary | ICD-10-CM

## 2023-02-09 DIAGNOSIS — Z86010 Personal history of colonic polyps: Secondary | ICD-10-CM

## 2023-02-09 DIAGNOSIS — E1169 Type 2 diabetes mellitus with other specified complication: Secondary | ICD-10-CM

## 2023-02-09 DIAGNOSIS — E1129 Type 2 diabetes mellitus with other diabetic kidney complication: Secondary | ICD-10-CM

## 2023-02-09 DIAGNOSIS — E6609 Other obesity due to excess calories: Secondary | ICD-10-CM

## 2023-02-09 DIAGNOSIS — E1142 Type 2 diabetes mellitus with diabetic polyneuropathy: Secondary | ICD-10-CM

## 2023-02-14 NOTE — Patient Instructions (Signed)
Diabetes Mellitus Basics  Diabetes mellitus, or diabetes, is a long-term (chronic) disease. It occurs when the body does not properly use sugar (glucose) that is released from food after you eat. Diabetes mellitus may be caused by one or both of these problems: Your pancreas does not make enough of a hormone called insulin. Your body does not react in a normal way to the insulin that it makes. Insulin lets glucose enter cells in your body. This gives you energy. If you have diabetes, glucose cannot get into cells. This causes high blood glucose (hyperglycemia). How to treat and manage diabetes You may need to take insulin or other diabetes medicines daily to keep your glucose in balance. If you are prescribed insulin, you will learn how to give yourself insulin by injection. You may need to adjust the amount of insulin you take based on the foods that you eat. You will need to check your blood glucose levels using a glucose monitor as told by your health care provider. The readings can help determine if you have low or high blood glucose. Generally, you should have these blood glucose levels: Before meals (preprandial): 80-130 mg/dL (4.4-7.2 mmol/L). After meals (postprandial): below 180 mg/dL (10 mmol/L). Hemoglobin A1c (HbA1c) level: less than 7%. Your health care provider will set treatment goals for you. Keep all follow-up visits. This is important. Follow these instructions at home: Diabetes medicines Take your diabetes medicines every day as told by your health care provider. List your diabetes medicines here: Name of medicine: ______________________________ Amount (dose): _______________ Time (a.m./p.m.): _______________ Notes: ___________________________________ Name of medicine: ______________________________ Amount (dose): _______________ Time (a.m./p.m.): _______________ Notes: ___________________________________ Name of medicine: ______________________________ Amount (dose):  _______________ Time (a.m./p.m.): _______________ Notes: ___________________________________ Insulin If you use insulin, list the types of insulin you use here: Insulin type: ______________________________ Amount (dose): _______________ Time (a.m./p.m.): _______________Notes: ___________________________________ Insulin type: ______________________________ Amount (dose): _______________ Time (a.m./p.m.): _______________ Notes: ___________________________________ Insulin type: ______________________________ Amount (dose): _______________ Time (a.m./p.m.): _______________ Notes: ___________________________________ Insulin type: ______________________________ Amount (dose): _______________ Time (a.m./p.m.): _______________ Notes: ___________________________________ Insulin type: ______________________________ Amount (dose): _______________ Time (a.m./p.m.): _______________ Notes: ___________________________________ Managing blood glucose  Check your blood glucose levels using a glucose monitor as told by your health care provider. Write down the times that you check your glucose levels here: Time: _______________ Notes: ___________________________________ Time: _______________ Notes: ___________________________________ Time: _______________ Notes: ___________________________________ Time: _______________ Notes: ___________________________________ Time: _______________ Notes: ___________________________________ Time: _______________ Notes: ___________________________________  Low blood glucose Low blood glucose (hypoglycemia) is when glucose is at or below 70 mg/dL (3.9 mmol/L). Symptoms may include: Feeling: Hungry. Sweaty and clammy. Irritable or easily upset. Dizzy. Sleepy. Having: A fast heartbeat. A headache. A change in your vision. Numbness around the mouth, lips, or tongue. Having trouble with: Moving (coordination). Sleeping. Treating low blood glucose To treat low blood  glucose, eat or drink something containing sugar right away. If you can think clearly and swallow safely, follow the 15:15 rule: Take 15 grams of a fast-acting carb (carbohydrate), as told by your health care provider. Some fast-acting carbs are: Glucose tablets: take 3-4 tablets. Hard candy: eat 3-5 pieces. Fruit juice: drink 4 oz (120 mL). Regular (not diet) soda: drink 4-6 oz (120-180 mL). Honey or sugar: eat 1 Tbsp (15 mL). Check your blood glucose levels 15 minutes after you take the carb. If your glucose is still at or below 70 mg/dL (3.9 mmol/L), take 15 grams of a carb again. If your glucose does not go above 70 mg/dL (3.9 mmol/L) after   3 tries, get help right away. After your glucose goes back to normal, eat a meal or a snack within 1 hour. Treating very low blood glucose If your glucose is at or below 54 mg/dL (3 mmol/L), you have very low blood glucose (severe hypoglycemia). This is an emergency. Do not wait to see if the symptoms will go away. Get medical help right away. Call your local emergency services (911 in the [place] Do not drive yourself to the hospital. Questions to ask your health care provider Should I talk with a diabetes educator? What equipment will I need to care for myself at home? What diabetes medicines do I need? When should I take them? How often do I need to check my blood glucose levels? What number can I call if I have questions? When is my follow-up visit? Where can I find a support group for people with diabetes? Where to find more information American Diabetes Association: www.diabetes.org Association of Diabetes Care and Education Specialists: www.diabeteseducator.org Contact a health care provider if: Your blood glucose is at or above 240 mg/dL (13.3 mmol/L) for 2 days in a row. You have been sick or have had a fever for 2 days or more, and you are not getting better. You have any of these problems for more than 6 hours: You cannot eat or  drink. You feel nauseous. You vomit. You have diarrhea. Get help right away if: Your blood glucose is lower than 54 mg/dL (3 mmol/L). You get confused. You have trouble thinking clearly. You have trouble breathing. These symptoms may represent a serious problem that is an emergency. Do not wait to see if the symptoms will go away. Get medical help right away. Call your local emergency services (911 in the [place] Do not drive yourself to the hospital. Summary Diabetes mellitus is a chronic disease that occurs when the body does not properly use sugar (glucose) that is released from food after you eat. Take insulin and diabetes medicines as told. Check your blood glucose every day, as often as told. Keep all follow-up visits. This is important. This information is not intended to replace advice given to you by your health care provider. Make sure you discuss any questions you have with your health care provider. Document Revised: 11/21/2019 Document Reviewed: 11/21/2019 Elsevier Patient Education  2024 [employer]  

## 2023-02-19 ENCOUNTER — Ambulatory Visit (INDEPENDENT_AMBULATORY_CARE_PROVIDER_SITE_OTHER): Payer: 59 | Admitting: Nurse Practitioner

## 2023-02-19 ENCOUNTER — Encounter: Payer: Self-pay | Admitting: Nurse Practitioner

## 2023-02-19 VITALS — BP 136/72 | HR 65 | Temp 98.0°F | Ht 64.02 in | Wt 175.2 lb

## 2023-02-19 DIAGNOSIS — E1169 Type 2 diabetes mellitus with other specified complication: Secondary | ICD-10-CM | POA: Diagnosis not present

## 2023-02-19 DIAGNOSIS — Z6830 Body mass index (BMI) 30.0-30.9, adult: Secondary | ICD-10-CM | POA: Diagnosis not present

## 2023-02-19 DIAGNOSIS — E785 Hyperlipidemia, unspecified: Secondary | ICD-10-CM | POA: Diagnosis not present

## 2023-02-19 DIAGNOSIS — E1142 Type 2 diabetes mellitus with diabetic polyneuropathy: Secondary | ICD-10-CM

## 2023-02-19 DIAGNOSIS — E6609 Other obesity due to excess calories: Secondary | ICD-10-CM

## 2023-02-19 DIAGNOSIS — E1159 Type 2 diabetes mellitus with other circulatory complications: Secondary | ICD-10-CM

## 2023-02-19 DIAGNOSIS — I152 Hypertension secondary to endocrine disorders: Secondary | ICD-10-CM

## 2023-02-19 DIAGNOSIS — R809 Proteinuria, unspecified: Secondary | ICD-10-CM | POA: Diagnosis not present

## 2023-02-19 DIAGNOSIS — E1129 Type 2 diabetes mellitus with other diabetic kidney complication: Secondary | ICD-10-CM | POA: Diagnosis not present

## 2023-02-19 LAB — BAYER DCA HB A1C WAIVED: HB A1C (BAYER DCA - WAIVED): 7.4 % — ABNORMAL HIGH (ref 4.8–5.6)

## 2023-02-19 MED ORDER — METFORMIN HCL 1000 MG PO TABS: 1000.0000 mg | ORAL_TABLET | Freq: Two times a day (BID) | ORAL | 4 refills | Status: DC

## 2023-02-19 NOTE — Assessment & Plan Note (Signed)
Chronic, ongoing.  A1c 7.4% today, trend down from 7.8%. Urine ALB 01 December 2022 -- unable to take ACE or ARB due to history of cough.  Increase Metformin to 1000 MG BID, sent in new script and educated patient on this change.  Continue checking BS at least fasting once weekly and document. Will continue Gabapentin 300 - 600 MG prior to bed for neuropathy discomfort at work + B12.  Return in 3 months. - Can not take ACE or ARB - Statin on board - Needs to schedule eye exam - Foot exam up to date - Refuses multiple vaccinations - Discussed option of GLP1 to help avoid missed medication doses and help with weight loss -- she is fearful of needles. - Recommend she consistently take medications with no missed doses.

## 2023-02-19 NOTE — Assessment & Plan Note (Signed)
Chronic, ongoing.  Continue current medication regimen and adjust as needed. Lipid panel today. 

## 2023-02-19 NOTE — Progress Notes (Signed)
BP 136/72 (BP Location: Left Arm, Patient Position: Sitting)   Pulse 65   Temp 98 F (36.7 C) (Oral)   Ht 5' 4.02" (1.626 m)   Wt 175 lb 3.2 oz (79.5 kg)   LMP  (LMP Unknown)   SpO2 100%   BMI 30.06 kg/m    Subjective:    Patient ID: Julia Wood, female    DOB: July 30, 1961, 62 y.o.   MRN: [id number]  HPI: Julia Wood is a 62 y.o. female  Chief Complaint  Patient presents with   Diabetes   Hypertension   Hyperlipidemia   DIABETES A1c 7.8% in April, she wanted to work on diet.  Initially started on Metformin in February 2020.  Taking Metformin 1000 MG in the morning and 500 MG at night (missed one dose in past few months) + Gabapentin 600 MG at bedtime for neuropathy pain + taking B12. Reports diet has not been so good over past months, up and down -- varies day to day.  Hypoglycemic episodes:no Polydipsia/polyuria: no Visual disturbance: no Chest pain: no Paresthesias: no Glucose Monitoring: yes             Accucheck frequency: twice a month             Fasting glucose: this morning 116 -- 137, 133, 80             Post prandial:             Evening:             Before meals: Taking Insulin?: no             Long acting insulin:             Short acting insulin: Blood Pressure Monitoring: a few times a week Retinal Examination: Not up to [facility] Exam: Up to Date Pneumovax: Up To Date Influenza: Not Up to Date -- refuses Aspirin: yes    HYPERTENSION / HYPERLIPIDEMIA Continues on HCTZ, Amlodipine, and Crestor.  Losartan caused cough in past.  Has history of colon polyps, was scheduled for colonoscopy but insurance told her day of procedure they would not pay for it -- so she cancelled the last one scheduled. Satisfied with current treatment? yes Duration of hypertension: chronic BP monitoring frequency: on occasion BP range: on average 130/80 range BP medication side effects: no Duration of hyperlipidemia: chronic Cholesterol medication  side effects: no Cholesterol supplements: none Medication compliance: good compliance Aspirin: yes Recent stressors: no Recurrent headaches: no Visual changes: no Palpitations: no Dyspnea: no Chest pain: no Lower extremity edema: no Dizzy/lightheaded: no  The ASCVD Risk score (Arnett DK, et al., 2019) failed to calculate for the following reasons:   The valid total cholesterol range is 130 to 320 mg/dL     11/09/8117   14:78 AM 11/10/2022    8:35 AM 11/28/2021   11:18 AM 01/09/2021    9:33 AM 10/09/2020    9:50 AM  Depression screen PHQ 2/9  Decreased Interest 0 1 0 0 0  Down, Depressed, Hopeless 2 2 0 0 0  PHQ - 2 Score 2 3 0 0 0  Altered sleeping 1 2 2  0 0  Tired, decreased energy 2 1 0 0 0  Change in appetite 2 0 1 0 0  Feeling bad or failure about yourself  0 0 0 0 0  Trouble concentrating 0 0 0 0 0  Moving slowly or fidgety/restless 0 0 0  0 0  Suicidal thoughts 0 0 0 0 0  PHQ-9 Score 7 6 3  0 0  Difficult doing work/chores  Somewhat difficult Not difficult at all         02/19/2023   11:39 AM 11/10/2022    8:36 AM 11/28/2021   11:19 AM  GAD 7 : Generalized Anxiety Score  Nervous, Anxious, on Edge 0 0 0  Control/stop worrying 0 0 1  Worry too much - different things 0 0 1  Trouble relaxing 1 1 1   Restless 0 0 0  Easily annoyed or irritable 0 0 0  Afraid - awful might happen 0 1 0  Total GAD 7 Score 1 2 3   Anxiety Difficulty Not difficult at all Not difficult at all Not difficult at all   Relevant past medical, surgical, family and social history reviewed and updated as indicated. Interim medical history since our last visit reviewed. Allergies and medications reviewed and updated.  Review of Systems  Constitutional:  Negative for activity change, appetite change, diaphoresis, fatigue and fever.  Respiratory:  Negative for cough, chest tightness and shortness of breath.   Cardiovascular:  Negative for chest pain, palpitations and leg swelling.  Gastrointestinal:  Negative.   Endocrine: Negative for cold intolerance, heat intolerance, polydipsia, polyphagia and polyuria.  Neurological: Negative.   Psychiatric/Behavioral: Negative.      Per HPI unless specifically indicated above     Objective:    BP 136/72 (BP Location: Left Arm, Patient Position: Sitting)   Pulse 65   Temp 98 F (36.7 C) (Oral)   Ht 5' 4.02" (1.626 m)   Wt 175 lb 3.2 oz (79.5 kg)   LMP  (LMP Unknown)   SpO2 100%   BMI 30.06 kg/m   Wt Readings from Last 3 Encounters:  02/19/23 175 lb 3.2 oz (79.5 kg)  11/10/22 182 lb (82.6 kg)  05/11/22 176 lb 9.6 oz (80.1 kg)    Physical Exam Vitals and nursing note reviewed.  Constitutional:      General: She is awake. She is not in acute distress.    Appearance: She is well-developed and well-groomed. She is obese. She is not ill-appearing.  HENT:     Head: Normocephalic.     Right Ear: Hearing normal.     Left Ear: Hearing normal.  Eyes:     General: Lids are normal.        Right eye: No discharge.        Left eye: No discharge.     Conjunctiva/sclera: Conjunctivae normal.     Pupils: Pupils are equal, round, and reactive to light.  Neck:     Thyroid: No thyromegaly.     Vascular: No carotid bruit.  Cardiovascular:     Rate and Rhythm: Normal rate and regular rhythm.     Heart sounds: Normal heart sounds. No murmur heard.    No gallop.  Pulmonary:     Effort: Pulmonary effort is normal. No accessory muscle usage or respiratory distress.     Breath sounds: Normal breath sounds.  Abdominal:     General: Bowel sounds are normal.     Palpations: Abdomen is soft.  Musculoskeletal:     Cervical back: Normal range of motion and neck supple.     Right lower leg: No edema.     Left lower leg: No edema.  Skin:    General: Skin is warm and dry.  Neurological:     Mental Status: She is alert and oriented  to person, place, and time.  Psychiatric:        Attention and Perception: Attention normal.        Mood and Affect:  Mood normal.        Behavior: Behavior normal. Behavior is cooperative.        Thought Content: Thought content normal.        Judgment: Judgment normal.    Results for orders placed or performed in visit on 11/10/22  Bayer DCA Hb A1c Waived  Result Value Ref Range   HB A1C (BAYER DCA - WAIVED) 7.8 (H) 4.8 - 5.6 %  Microalbumin, Urine Waived  Result Value Ref Range   Microalb, Ur Waived 30 (H) 0 - 19 mg/L   Creatinine, Urine Waived 50 10 - 300 mg/dL   Microalb/Creat Ratio 30-300 (H) <30 mg/g  Comprehensive metabolic panel  Result Value Ref Range   Glucose 144 (H) 70 - 99 mg/dL   BUN 13 8 - 27 mg/dL   Creatinine, Ser 2.84 0.57 - 1.00 mg/dL   eGFR 77 >13 KG/MWN/0.27   BUN/Creatinine Ratio 15 12 - 28   Sodium 141 134 - 144 mmol/L   Potassium 3.9 3.5 - 5.2 mmol/L   Chloride 105 96 - 106 mmol/L   CO2 23 20 - 29 mmol/L   Calcium 8.9 8.7 - 10.3 mg/dL   Total Protein 5.8 (L) 6.0 - 8.5 g/dL   Albumin 3.8 (L) 3.9 - 4.9 g/dL   Globulin, Total 2.0 1.5 - 4.5 g/dL   Albumin/Globulin Ratio 1.9 1.2 - 2.2   Bilirubin Total <0.2 0.0 - 1.2 mg/dL   Alkaline Phosphatase 64 44 - 121 IU/L   AST 25 0 - 40 IU/L   ALT 19 0 - 32 IU/L  Lipid Panel w/o Chol/HDL Ratio  Result Value Ref Range   Cholesterol, Total 114 100 - 199 mg/dL   Triglycerides 253 (H) 0 - 149 mg/dL   HDL 28 (L) >66 mg/dL   VLDL Cholesterol Cal 40 5 - 40 mg/dL   LDL Chol Calc (NIH) 46 0 - 99 mg/dL  Vitamin Y40  Result Value Ref Range   Vitamin B-12 468 232 - 1,245 pg/mL      Assessment & Plan:   Problem List Items Addressed This Visit       Cardiovascular and Mediastinum   Hypertension associated with diabetes (HCC)    Chronic, stable.  BP at goal on recheck today in office.  Urine ALB 01 December 2022 -- unable to take ACE or ARB due to history of cough.  Continue current regimen and adjust as needed, may need to increase Amlodipine to 10 MG in future.  Recommend she monitor BP at least a few mornings a week at home and  document.  DASH diet at home.  LABS: CMP.  Return in 3 months.       Relevant Medications   metFORMIN (GLUCOPHAGE) 1000 MG tablet     Endocrine   Hyperlipidemia associated with type 2 diabetes mellitus (HCC)    Chronic, ongoing.  Continue current medication regimen and adjust as needed.  Lipid panel today.      Relevant Medications   metFORMIN (GLUCOPHAGE) 1000 MG tablet   Type 2 diabetes mellitus with peripheral neuropathy (HCC)    Chronic, ongoing.  A1c 7.4% today, trend down from 7.8%. Urine ALB 01 December 2022 -- unable to take ACE or ARB due to history of cough.  Increase Metformin to 1000 MG BID, sent in new  script and educated patient on this change.  Continue checking BS at least fasting once weekly and document. Will continue Gabapentin 300 - 600 MG prior to bed for neuropathy discomfort at work + B12.  Return in 3 months. - Can not take ACE or ARB - Statin on board - Needs to schedule eye exam - Foot exam up to date - Refuses multiple vaccinations - Discussed option of GLP1 to help avoid missed medication doses and help with weight loss -- she is fearful of needles. - Recommend she consistently take medications with no missed doses.      Relevant Medications   metFORMIN (GLUCOPHAGE) 1000 MG tablet   Type 2 diabetes mellitus with proteinuria (HCC) - Primary    Chronic, ongoing.  A1c 7.4% today, trend down from 7.8%. Urine ALB 01 December 2022 -- unable to take ACE or ARB due to history of cough.  Increase Metformin to 1000 MG BID, sent in new script and educated patient on this change.  Continue checking BS at least fasting once weekly and document. Will continue Gabapentin 300 - 600 MG prior to bed for neuropathy discomfort at work + B12.  Return in 3 months. - Can not take ACE or ARB - Statin on board - Needs to schedule eye exam - Foot exam up to date - Refuses multiple vaccinations - Discussed option of GLP1 to help avoid missed medication doses and help with weight loss --  she is fearful of needles. - Recommend she consistently take medications with no missed doses.      Relevant Medications   metFORMIN (GLUCOPHAGE) 1000 MG tablet     Other   Obesity    BMI 30.06.  Recommended eating smaller high protein, low fat meals more frequently and exercising 30 mins a day 5 times a week with a goal of 10-15lb weight loss in the next 3 months. Patient voiced their understanding and motivation to adhere to these recommendations.       Relevant Medications   metFORMIN (GLUCOPHAGE) 1000 MG tablet     Follow up plan: Return in about 3 months (around 05/22/2023) for T2DM, HTN/HLD.

## 2023-02-19 NOTE — Assessment & Plan Note (Signed)
BMI 30.06.  Recommended eating smaller high protein, low fat meals more frequently and exercising 30 mins a day 5 times a week with a goal of 10-15lb weight loss in the next 3 months. Patient voiced their understanding and motivation to adhere to these recommendations.

## 2023-02-19 NOTE — Assessment & Plan Note (Signed)
Chronic, stable.  BP at goal on recheck today in office.  Urine ALB 01 December 2022 -- unable to take ACE or ARB due to history of cough.  Continue current regimen and adjust as needed, may need to increase Amlodipine to 10 MG in future.  Recommend she monitor BP at least a few mornings a week at home and document.  DASH diet at home.  LABS: CMP.  Return in 3 months.

## 2023-02-19 NOTE — Assessment & Plan Note (Signed)
Scheduled for colonoscopy, but day of found out it would not be covered by insurance, although GI had advised one year recheck -- have instructed her to reach out to insurance on this and ensure she is able to obtain recheck.

## 2023-05-18 ENCOUNTER — Other Ambulatory Visit: Payer: Self-pay | Admitting: Nurse Practitioner

## 2023-05-18 MED ORDER — AMLODIPINE BESYLATE 5 MG PO TABS: 5.0000 mg | ORAL_TABLET | Freq: Every day | ORAL | 4 refills | Status: DC

## 2023-05-18 NOTE — Telephone Encounter (Signed)
Routing to provider  

## 2023-05-18 NOTE — Telephone Encounter (Signed)
Patient came by to request refill for Amlodipine 5mg  to be filled by Aura Dials, NP and called into [facility]. Informed patient to allow provider 48-72 hours to review/respond. I am forwarding this message to the clinical staff. Patient wish to receive call when complete [phone].

## 2023-05-22 DIAGNOSIS — Z2821 Immunization not carried out because of patient refusal: Secondary | ICD-10-CM | POA: Insufficient documentation

## 2023-05-22 NOTE — Patient Instructions (Signed)
Be Involved in Caring For Your Health:  Taking Medications When medications are taken as directed, they can greatly improve your health. But if they are not taken as prescribed, they may not work. In some cases, not taking them correctly can be harmful. To help ensure your treatment remains effective and safe, understand your medications and how to take them. Bring your medications to each visit for review by your provider.  Your lab results, notes, and after visit summary will be available on My Chart. We strongly encourage you to use this feature. If lab results are abnormal the clinic will contact you with the appropriate steps. If the clinic does not contact you assume the results are satisfactory. You can always view your results on My Chart. If you have questions regarding your health or results, please contact the clinic during office hours. You can also ask questions on My Chart.  We at [facility] are grateful that you chose [place] to provide your care. We strive to provide evidence-based and compassionate care and are always looking for feedback. If you get a survey from the clinic please complete this so we can hear your opinions.  Diabetes Mellitus and Exercise Regular exercise is important for your health, especially if you have diabetes mellitus. Exercise is not just about losing weight. It can also help you increase muscle strength and bone density and reduce body fat and stress. This can help your level of endurance and make you more fit and flexible. Why should I exercise if I have diabetes? Exercise has many benefits for people with diabetes. It can: Help lower and control your blood sugar (glucose). Help your body respond better and become more sensitive to the hormone insulin. Reduce how much insulin your body needs. Lower your risk for heart disease by: Lowering how much "bad" cholesterol and triglycerides you have in your body. Increasing how much "good" cholesterol  you have in your body. Lowering your blood pressure. Lowering your blood glucose levels. What is my activity plan? Your health care provider or an expert trained in diabetes care (certified diabetes educator) can help you make an activity plan. This plan can help you find the type of exercise that works for you. It may also tell you how often to exercise and for how long. Be sure to: Get at least 150 minutes of medium-intensity or high-intensity exercise each week. This may involve brisk walking, biking, or water aerobics. Do stretching and strengthening exercises at least 2 times a week. This may involve yoga or weight lifting. Spread out your activity over at least 3 days of the week. Get some form of physical activity each day. Do not go more than 2 days in a row without some kind of activity. Avoid being inactive for more than 30 minutes at a time. Take frequent breaks to walk or stretch. Choose activities that you enjoy. Set goals that you know you can accomplish. Start slowly and increase the intensity of your exercise over time. How do I manage my diabetes during exercise?  Monitor your blood glucose Check your blood glucose before and after you exercise. If your blood glucose is 240 mg/dL (40.9 mmol/L) or higher before you exercise, check your urine for ketones. These are chemicals created by the liver. If you have ketones in your urine, do not exercise until your blood glucose returns to normal. If your blood glucose is 100 mg/dL (5.6 mmol/L) or lower, eat a snack that has 15-20 grams of carbohydrate in  it. Check your blood glucose 15 minutes after the snack to make sure that your level is above 100 mg/dL (5.6 mmol/L) before you start to exercise. Your risk for low blood glucose (hypoglycemia) goes up during and after exercise. Know the symptoms of this condition and how to treat it. Follow these instructions at home: Keep a carbohydrate snack on hand for use before, during, and after  exercise. This can help prevent or treat hypoglycemia. Avoid injecting insulin into parts of your body that are going to be used during exercise. This may include: Your arms, when you are going to play tennis. Your legs, when you are about to go jogging. Keep track of your exercise habits. This can help you and your health care provider watch and adjust your activity plan. Write down: What you eat before and after you exercise. Blood glucose levels before and after you exercise. The type and amount of exercise you do. Talk to your health care provider before you start a new activity. They may need to: Make sure that the activity is safe for you. Adjust your insulin, other medicines, and food that you eat. Drink water while you exercise. This can stop you from losing too much water (dehydration). It can also prevent problems caused by having a lot of heat in your body (heat stroke). Where to find more information American Diabetes Association: diabetes.org Association of Diabetes Care & Education Specialists: diabeteseducator.org This information is not intended to replace advice given to you by your health care provider. Make sure you discuss any questions you have with your health care provider. Document Revised: 01/07/2022 Document Reviewed: 01/07/2022 Elsevier Patient Education  2024 [employer].

## 2023-05-24 ENCOUNTER — Encounter: Payer: Self-pay | Admitting: Nurse Practitioner

## 2023-05-24 ENCOUNTER — Ambulatory Visit (INDEPENDENT_AMBULATORY_CARE_PROVIDER_SITE_OTHER): Payer: 59 | Admitting: Nurse Practitioner

## 2023-05-24 VITALS — BP 128/64 | HR 70 | Temp 98.2°F | Ht 64.0 in | Wt 172.4 lb

## 2023-05-24 DIAGNOSIS — Z2821 Immunization not carried out because of patient refusal: Secondary | ICD-10-CM

## 2023-05-24 DIAGNOSIS — E669 Obesity, unspecified: Secondary | ICD-10-CM | POA: Diagnosis not present

## 2023-05-24 DIAGNOSIS — E1129 Type 2 diabetes mellitus with other diabetic kidney complication: Secondary | ICD-10-CM

## 2023-05-24 DIAGNOSIS — E66811 Obesity, class 1: Secondary | ICD-10-CM

## 2023-05-24 DIAGNOSIS — Z7984 Long term (current) use of oral hypoglycemic drugs: Secondary | ICD-10-CM

## 2023-05-24 DIAGNOSIS — Z860100 Personal history of colon polyps, unspecified: Secondary | ICD-10-CM

## 2023-05-24 DIAGNOSIS — E1169 Type 2 diabetes mellitus with other specified complication: Secondary | ICD-10-CM | POA: Diagnosis not present

## 2023-05-24 DIAGNOSIS — E538 Deficiency of other specified B group vitamins: Secondary | ICD-10-CM | POA: Diagnosis not present

## 2023-05-24 DIAGNOSIS — Z6829 Body mass index (BMI) 29.0-29.9, adult: Secondary | ICD-10-CM

## 2023-05-24 DIAGNOSIS — I152 Hypertension secondary to endocrine disorders: Secondary | ICD-10-CM

## 2023-05-24 DIAGNOSIS — E1159 Type 2 diabetes mellitus with other circulatory complications: Secondary | ICD-10-CM | POA: Diagnosis not present

## 2023-05-24 DIAGNOSIS — E1142 Type 2 diabetes mellitus with diabetic polyneuropathy: Secondary | ICD-10-CM | POA: Diagnosis not present

## 2023-05-24 DIAGNOSIS — R809 Proteinuria, unspecified: Secondary | ICD-10-CM

## 2023-05-24 DIAGNOSIS — E785 Hyperlipidemia, unspecified: Secondary | ICD-10-CM | POA: Diagnosis not present

## 2023-05-24 DIAGNOSIS — Z1211 Encounter for screening for malignant neoplasm of colon: Secondary | ICD-10-CM

## 2023-05-24 DIAGNOSIS — M25512 Pain in left shoulder: Secondary | ICD-10-CM

## 2023-05-24 NOTE — Assessment & Plan Note (Signed)
GI referral in place for repeat colonoscopy.

## 2023-05-24 NOTE — Assessment & Plan Note (Signed)
Chronic, ongoing.  Continue current medication regimen and adjust as needed. Lipid panel today. 

## 2023-05-24 NOTE — Assessment & Plan Note (Signed)
Chronic, stable.  BP at goal on recheck today in office and at goal on home readings.  Urine ALB 01 December 2022 -- unable to take ACE or ARB due to history of cough.  Continue current regimen and adjust as needed, may need to increase Amlodipine to 10 MG in future.  Recommend she monitor BP at least a few mornings a week at home and document.  DASH diet at home.  LABS: CMP, TSH.  Return in 3 months.

## 2023-05-24 NOTE — Assessment & Plan Note (Signed)
Chronic, ongoing.  A1c 7.4% last visit, trend down from 7.8%. Recheck today.  Urine ALB 01 December 2022 -- unable to take ACE or ARB due to history of cough.  Continue Metformin 1000 MG BID, recommend she get night time pill box and place Gabapentin and Metformin in there to help remind her.  Continue checking BS at least fasting once weekly and document. Will continue Gabapentin 300 - 600 MG prior to bed for neuropathy discomfort at work + B12.  Return in 3 months. - Can not take ACE or ARB - Statin on board - Needs to schedule eye exam - Foot exam up to date - Refuses multiple vaccinations - Discussed option of GLP1 to help avoid missed medication doses and help with weight loss -- she is fearful of needles. - Recommend she consistently take medications with no missed doses.

## 2023-05-24 NOTE — Progress Notes (Addendum)
BP 128/64 (BP Location: Left Arm, Patient Position: Sitting)   Pulse 70   Temp 98.2 F (36.8 C) (Oral)   Ht 5\' 4"  (1.626 m)   Wt 172 lb 6.4 oz (78.2 kg)   LMP  (LMP Unknown)   SpO2 98%   BMI 29.59 kg/m    Subjective:    Patient ID: Julia Wood, female    DOB: 04-23-1961, 62 y.o.   MRN: [id number]  HPI: Julia Wood is a 62 y.o. female  Chief Complaint  Patient presents with   Diabetes    No recent eye exam per patient   Hyperlipidemia   Hypertension   DIABETES A1c 7.4% July.  Taking Metformin 1000 MG in the morning and 500 MG at night (continues to miss night time dose twice a week) + Gabapentin 600 MG at bedtime for neuropathy pain.  Does have pill box for morning medications.  Has lost 10 pounds since April, is walking the track about 3 times a week.  Will go around it 5 times. Hypoglycemic episodes:no Polydipsia/polyuria: no Visual disturbance: no Chest pain: no Paresthesias: no Glucose Monitoring: yes             Accucheck frequency: every two weeks             Fasting glucose: this morning 100, on average 140 range             Post prandial:             Evening:             Before meals: Taking Insulin?: no             Long acting insulin:             Short acting insulin: Blood Pressure Monitoring: a few times a week Retinal Examination: Not up to Date -- [facility] Foot Exam: Up to Date Pneumovax: Not up to Date -- refuses Influenza: Not Up to Date -- refuses Aspirin: yes    HYPERTENSION / HYPERLIPIDEMIA Continues on HCTZ, Amlodipine, and Crestor.    History of colon polyps, was scheduled for colonoscopy but insurance told her day of procedure they would not pay for it -- so cancelled.  She would like to have performed now. Satisfied with current treatment? yes Duration of hypertension: chronic BP monitoring frequency: on occasion BP range: reports on average <130/80 BP medication side effects: no Duration of hyperlipidemia:  chronic Cholesterol medication side effects: no Cholesterol supplements: none Medication compliance: good compliance Aspirin: yes Recent stressors: no Recurrent headaches: no Visual changes: no Palpitations: no Dyspnea: no Chest pain: no Lower extremity edema: no Dizzy/lightheaded: no  The ASCVD Risk score (Arnett DK, et al., 2019) failed to calculate for the following reasons:   The valid total cholesterol range is 130 to 320 mg/dL  SHOULDER PAIN Started 2 days ago.  Left shoulder.  She is left hand dominant. Duration: days Involved shoulder: left Mechanism of injury: unknown Location: posterior Onset:gradual Severity: 6/10  Quality:  dull, aching, and throbbing -- pins and needles Frequency: intermittent Radiation: no Aggravating factors: unknown  Alleviating factors: nothing  Status: stable Treatments attempted: [place],   Relief with NSAIDs?:  No NSAIDs Taken Weakness: no Numbness: yes Decreased grip strength: no Redness: no Swelling: no Bruising: no Fevers: no   Relevant past medical, surgical, family and social history reviewed and updated as indicated. Interim medical history since our last visit reviewed. Allergies and medications reviewed and updated.  Review of Systems  Constitutional:  Negative for activity change, appetite change, diaphoresis, fatigue and fever.  Respiratory:  Negative for cough, chest tightness and shortness of breath.   Cardiovascular:  Negative for chest pain, palpitations and leg swelling.  Gastrointestinal: Negative.   Endocrine: Negative for cold intolerance, heat intolerance, polydipsia, polyphagia and polyuria.  Neurological: Negative.   Psychiatric/Behavioral: Negative.     Per HPI unless specifically indicated above     Objective:    BP 128/64 (BP Location: Left Arm, Patient Position: Sitting)   Pulse 70   Temp 98.2 F (36.8 C) (Oral)   Ht 5\' 4"  (1.626 m)   Wt 172 lb 6.4 oz (78.2 kg)   LMP  (LMP Unknown)   SpO2 98%    BMI 29.59 kg/m   Wt Readings from Last 3 Encounters:  05/24/23 172 lb 6.4 oz (78.2 kg)  02/19/23 175 lb 3.2 oz (79.5 kg)  11/10/22 182 lb (82.6 kg)    Physical Exam Vitals and nursing note reviewed.  Constitutional:      General: She is awake. She is not in acute distress.    Appearance: She is well-developed and well-groomed. She is obese. She is not ill-appearing.  HENT:     Head: Normocephalic.     Right Ear: Hearing normal.     Left Ear: Hearing normal.  Eyes:     General: Lids are normal.        Right eye: No discharge.        Left eye: No discharge.     Conjunctiva/sclera: Conjunctivae normal.     Pupils: Pupils are equal, round, and reactive to light.  Neck:     Thyroid: No thyromegaly.     Vascular: No carotid bruit.  Cardiovascular:     Rate and Rhythm: Normal rate and regular rhythm.     Heart sounds: Normal heart sounds. No murmur heard.    No gallop.  Pulmonary:     Effort: Pulmonary effort is normal. No accessory muscle usage or respiratory distress.     Breath sounds: Normal breath sounds.  Abdominal:     General: Bowel sounds are normal.     Palpations: Abdomen is soft.  Musculoskeletal:     Right shoulder: Normal.     Left shoulder: Normal.     Cervical back: Normal range of motion and neck supple.     Right lower leg: No edema.     Left lower leg: No edema.  Skin:    General: Skin is warm and dry.  Neurological:     Mental Status: She is alert and oriented to person, place, and time.  Psychiatric:        Attention and Perception: Attention normal.        Mood and Affect: Mood normal.        Behavior: Behavior normal. Behavior is cooperative.        Thought Content: Thought content normal.        Judgment: Judgment normal.    Diabetic Foot Exam - Simple   Simple Foot Form Visual Inspection No deformities, no ulcerations, no other skin breakdown bilaterally: Yes Sensation Testing Intact to touch and monofilament testing bilaterally:  Yes Pulse Check Posterior Tibialis and Dorsalis pulse intact bilaterally: Yes Comments     Results for orders placed or performed in visit on 02/19/23  Bayer DCA Hb A1c Waived  Result Value Ref Range   HB A1C (BAYER DCA - WAIVED) 7.4 (H) 4.8 - 5.6 %  Assessment & Plan:   Problem List Items Addressed This Visit       Cardiovascular and Mediastinum   Hypertension associated with diabetes (HCC)    Chronic, stable.  BP at goal on recheck today in office and at goal on home readings.  Urine ALB 01 December 2022 -- unable to take ACE or ARB due to history of cough.  Continue current regimen and adjust as needed, may need to increase Amlodipine to 10 MG in future.  Recommend she monitor BP at least a few mornings a week at home and document.  DASH diet at home.  LABS: CMP, TSH.  Return in 3 months.       Relevant Orders   HgB A1c   Comprehensive metabolic panel   TSH     Endocrine   Hyperlipidemia associated with type 2 diabetes mellitus (HCC)    Chronic, ongoing.  Continue current medication regimen and adjust as needed.  Lipid panel today.      Relevant Orders   HgB A1c   Comprehensive metabolic panel   Lipid Panel w/o Chol/HDL Ratio   Type 2 diabetes mellitus with peripheral neuropathy (HCC)    Chronic, ongoing.  A1c 7.4% last visit, trend down from 7.8%. Recheck today.  Urine ALB 01 December 2022 -- unable to take ACE or ARB due to history of cough.  Continue Metformin 1000 MG BID, recommend she get night time pill box and place Gabapentin and Metformin in there to help remind her.  Continue checking BS at least fasting once weekly and document. Will continue Gabapentin 300 - 600 MG prior to bed for neuropathy discomfort at work + B12.  Return in 3 months. - Can not take ACE or ARB - Statin on board - Needs to schedule eye exam - Foot exam up to date - Refuses multiple vaccinations - Discussed option of GLP1 to help avoid missed medication doses and help with weight loss -- she  is fearful of needles. - Recommend she consistently take medications with no missed doses.      Relevant Orders   HgB A1c   Type 2 diabetes mellitus with proteinuria (HCC) - Primary    Chronic, ongoing.  A1c 7.4% last visit, trend down from 7.8%. Recheck today.  Urine ALB 01 December 2022 -- unable to take ACE or ARB due to history of cough.  Continue Metformin 1000 MG BID, recommend she get night time pill box and place Gabapentin and Metformin in there to help remind her.  Continue checking BS at least fasting once weekly and document. Will continue Gabapentin 300 - 600 MG prior to bed for neuropathy discomfort at work + B12.  Return in 3 months. - Can not take ACE or ARB - Statin on board - Needs to schedule eye exam - Foot exam up to date - Refuses multiple vaccinations - Discussed option of GLP1 to help avoid missed medication doses and help with weight loss -- she is fearful of needles. - Recommend she consistently take medications with no missed doses.      Relevant Orders   HgB A1c     Other   Flu vaccine refused    Refuses all vaccinations, have provided education.      History of colonic polyps    GI referral in place for repeat colonoscopy.      Obesity    BMI 29.59.  Recommended eating smaller high protein, low fat meals more frequently and exercising 30 mins a day 5  times a week with a goal of 10-15lb weight loss in the next 3 months. Patient voiced their understanding and motivation to adhere to these recommendations.       Other Visit Diagnoses     B12 deficiency       Check level today.   Relevant Orders   Vitamin B12   Acute pain of left shoulder       Does not like to take pills, recommend she use Voltaren gel at home and perform shoulder impingment PT exercises.  Return if worsening.   Screening for colon cancer       Referral to GI.   Relevant Orders   Ambulatory referral to Gastroenterology        Follow up plan: Return in about 3 months (around  08/24/2023) for T2DM, HTN/HLD.

## 2023-05-24 NOTE — Assessment & Plan Note (Signed)
BMI 29.59.  Recommended eating smaller high protein, low fat meals more frequently and exercising 30 mins a day 5 times a week with a goal of 10-15lb weight loss in the next 3 months. Patient voiced their understanding and motivation to adhere to these recommendations.  

## 2023-05-24 NOTE — Assessment & Plan Note (Signed)
Refuses all vaccinations, have provided education.

## 2023-05-25 LAB — TSH: TSH: 3.88 u[IU]/mL (ref 0.450–4.500)

## 2023-05-25 LAB — HEMOGLOBIN A1C
Est. average glucose Bld gHb Est-mCnc: 157 mg/dL
Hgb A1c MFr Bld: 7.1 % — ABNORMAL HIGH (ref 4.8–5.6)

## 2023-05-25 LAB — COMPREHENSIVE METABOLIC PANEL
ALT: 20 [IU]/L (ref 0–32)
AST: 23 [IU]/L (ref 0–40)
Albumin: 4.1 g/dL (ref 3.9–4.9)
Alkaline Phosphatase: 76 [IU]/L (ref 44–121)
BUN/Creatinine Ratio: 21 (ref 12–28)
BUN: 13 mg/dL (ref 8–27)
Bilirubin Total: <0.2 mg/dL (ref 0.0–1.2)
CO2: 29 mmol/L (ref 20–29)
Calcium: 9.9 mg/dL (ref 8.7–10.3)
Chloride: 98 mmol/L (ref 96–106)
Creatinine, Ser: 0.61 mg/dL (ref 0.57–1.00)
Globulin, Total: 2.3 g/dL (ref 1.5–4.5)
Glucose: 135 mg/dL — ABNORMAL HIGH (ref 70–99)
Potassium: 3.5 mmol/L (ref 3.5–5.2)
Sodium: 142 mmol/L (ref 134–144)
Total Protein: 6.4 g/dL (ref 6.0–8.5)
eGFR: 100 mL/min/{1.73_m2} (ref >59)

## 2023-05-25 LAB — LIPID PANEL W/O CHOL/HDL RATIO
Cholesterol, Total: 139 mg/dL (ref 100–199)
HDL: 29 mg/dL — ABNORMAL LOW (ref >39)
LDL Chol Calc (NIH): 47 mg/dL (ref 0–99)
Triglycerides: 429 mg/dL — ABNORMAL HIGH (ref 0–149)
VLDL Cholesterol Cal: 63 mg/dL — ABNORMAL HIGH (ref 5–40)

## 2023-05-25 LAB — VITAMIN B12: Vitamin B-12: 521 pg/mL (ref 232–1245)

## 2023-05-25 NOTE — Progress Notes (Signed)
Good afternoon, please let Julia Wood know her labs have returned: - A1c is coming down at 7.1% from 7.4%, we do not need any medication changes at this time but please ensure you are taking night time Metformin as suspect your A1c would be under 7 if taken regularly.:) - Kidney and liver function are normal.  Any questions? Keep being amazing!!  Thank you for allowing me to participate in your care.  I appreciate you. Kindest regards, Aliece Honold

## 2023-05-27 ENCOUNTER — Telehealth: Payer: Self-pay

## 2023-05-27 NOTE — Telephone Encounter (Signed)
ERROR

## 2023-06-07 ENCOUNTER — Encounter: Payer: Self-pay | Admitting: *Deleted

## 2023-06-07 ENCOUNTER — Other Ambulatory Visit: Payer: Self-pay | Admitting: Nurse Practitioner

## 2023-06-08 NOTE — Telephone Encounter (Signed)
Requested medication (s) are due for refill today: expired medication date   Requested medication (s) are on the active medication list: yes   Last refill:  05/11/22 #90 4 refills  Future visit scheduled: yes in 2 months  Notes to clinic:   can patient get another courtesy refill for 2 months.? Expired medication date     Requested Prescriptions  Pending Prescriptions Disp Refills   hydrochlorothiazide (HYDRODIURIL) 25 MG tablet [Pharmacy Med Name: hydroCHLOROthiazide 25 MG Oral Tablet] 30 tablet 0    Sig: Take 1 tablet by mouth once daily     Cardiovascular: Diuretics - Thiazide Passed - 06/07/2023  4:28 PM      Passed - Cr in normal range and within 180 days    Creatinine, Ser  Date Value Ref Range Status  05/24/2023 0.61 0.57 - 1.00 mg/dL Final         Passed - K in normal range and within 180 days    Potassium  Date Value Ref Range Status  05/24/2023 3.5 3.5 - 5.2 mmol/L Final         Passed - Na in normal range and within 180 days    Sodium  Date Value Ref Range Status  05/24/2023 142 134 - 144 mmol/L Final         Passed - Last BP in normal range    BP Readings from Last 1 Encounters:  05/24/23 128/64         Passed - Valid encounter within last 6 months    Recent Outpatient Visits           2 weeks ago Type 2 diabetes mellitus with proteinuria (HCC)   [place] [facility] Coyle, [place] T, NP   3 months ago Type 2 diabetes mellitus with proteinuria (HCC)   [place] [facility] Cesar Chavez, [place] T, NP   7 months ago Type 2 diabetes mellitus with proteinuria (HCC)   [place] [facility] [place], Corrie Dandy T, NP   1 year ago Type 2 diabetes mellitus with proteinuria (HCC)   [place] [facility] [place], Corrie Dandy T, NP   1 year ago Type 2 diabetes mellitus with proteinuria (HCC)   [place] [facility] [place], Dorie Rank, NP       Future Appointments             In 2 months Cannady,  Dorie Rank, NP [place] [facility], [facility]

## 2023-07-04 ENCOUNTER — Other Ambulatory Visit: Payer: Self-pay | Admitting: Nurse Practitioner

## 2023-07-07 NOTE — Telephone Encounter (Signed)
Requested medication (s) are due for refill today: Yes  Requested medication (s) are on the active medication list: Yes  Last refill:  05/11/22  Future visit scheduled: Yes  Notes to clinic:  Prescription has expired.    Requested Prescriptions  Pending Prescriptions Disp Refills   rosuvastatin (CRESTOR) 10 MG tablet [Pharmacy Med Name: Rosuvastatin Calcium 10 MG Oral Tablet] 90 tablet 0    Sig: Take 1 tablet by mouth once daily     Cardiovascular:  Antilipid - Statins 2 Failed - 07/04/2023 11:32 AM      Failed - Lipid Panel in normal range within the last 12 months    Cholesterol, Total  Date Value Ref Range Status  05/24/2023 139 100 - 199 mg/dL Final   Cholesterol Piccolo, Waived  Date Value Ref Range Status  09/22/2019 119 <200 mg/dL Final    Comment:                            Desirable                <200                         Borderline High      200- 239                         High                     >239    LDL Chol Calc (NIH)  Date Value Ref Range Status  05/24/2023 47 0 - 99 mg/dL Final   HDL  Date Value Ref Range Status  05/24/2023 29 (L) >39 mg/dL Final   Triglycerides  Date Value Ref Range Status  05/24/2023 429 (H) 0 - 149 mg/dL Final   Triglycerides Piccolo,Waived  Date Value Ref Range Status  09/22/2019 219 (H) <150 mg/dL Final    Comment:                            Normal                   <150                         Borderline High     150 - 199                         High                200 - 499                         Very High                >499          Passed - Cr in normal range and within 360 days    Creatinine, Ser  Date Value Ref Range Status  05/24/2023 0.61 0.57 - 1.00 mg/dL Final         Passed - Patient is not pregnant      Passed - Valid encounter within last 12 months    Recent Outpatient Visits           1 month ago Type 2  diabetes mellitus with proteinuria (HCC)   [place] [facility]  Riner, [place] T, NP   4 months ago Type 2 diabetes mellitus with proteinuria (HCC)   [place] [facility] [place], [place] T, NP   7 months ago Type 2 diabetes mellitus with proteinuria (HCC)   [place] [facility] [place], Corrie Dandy T, NP   1 year ago Type 2 diabetes mellitus with proteinuria (HCC)   [place] [facility] [place], Corrie Dandy T, NP   1 year ago Type 2 diabetes mellitus with proteinuria (HCC)   [place] [facility] [place], Dorie Rank, NP       Future Appointments             In 1 month Cannady, Dorie Rank, NP [place] [facility], [facility]

## 2023-08-21 NOTE — Patient Instructions (Signed)
Be Involved in Caring For Your Health:  Taking Medications When medications are taken as directed, they can greatly improve your health. But if they are not taken as prescribed, they may not work. In some cases, not taking them correctly can be harmful. To help ensure your treatment remains effective and safe, understand your medications and how to take them. Bring your medications to each visit for review by your provider.  Your lab results, notes, and after visit summary will be available on My Chart. We strongly encourage you to use this feature. If lab results are abnormal the clinic will contact you with the appropriate steps. If the clinic does not contact you assume the results are satisfactory. You can always view your results on My Chart. If you have questions regarding your health or results, please contact the clinic during office hours. You can also ask questions on My Chart.  We at [facility] are grateful that you chose [place] to provide your care. We strive to provide evidence-based and compassionate care and are always looking for feedback. If you get a survey from the clinic please complete this so we can hear your opinions.  Diabetes Mellitus and Foot Care Diabetes, also called diabetes mellitus, may cause problems with your feet and legs because of poor blood flow (circulation). Poor circulation may make your skin: Become thinner and drier. Break more easily. Heal more slowly. Peel and crack. You may also have nerve damage (neuropathy). This can cause decreased feeling in your legs and feet. This means that you may not notice minor injuries to your feet that could lead to more serious problems. Finding and treating problems early is the best way to prevent future foot problems. How to care for your feet Foot hygiene  Wash your feet daily with warm water and mild soap. Do not use hot water. Then, pat your feet and the areas between your toes until they are fully dry. Do  not soak your feet. This can dry your skin. Trim your toenails straight across. Do not dig under them or around the cuticle. File the edges of your nails with an emery board or nail file. Apply a moisturizing lotion or petroleum jelly to the skin on your feet and to dry, brittle toenails. Use lotion that does not contain alcohol and is unscented. Do not apply lotion between your toes. Shoes and socks Wear clean socks or stockings every day. Make sure they are not too tight. Do not wear knee-high stockings. These may decrease blood flow to your legs. Wear shoes that fit well and have enough cushioning. Always look in your shoes before you put them on to be sure there are no objects inside. To break in new shoes, wear them for just a few hours a day. This prevents injuries on your feet. Wounds, scrapes, corns, and calluses  Check your feet daily for blisters, cuts, bruises, sores, and redness. If you cannot see the bottom of your feet, use a mirror or ask someone for help. Do not cut off corns or calluses or try to remove them with medicine. If you find a minor scrape, cut, or break in the skin on your feet, keep it and the skin around it clean and dry. You may clean these areas with mild soap and water. Do not clean the area with peroxide, alcohol, or iodine. If you have a wound, scrape, corn, or callus on your foot, look at it several times a day to make sure it  is healing and not infected. Check for: Redness, swelling, or pain. Fluid or blood. Warmth. Pus or a bad smell. General tips Do not cross your legs. This may decrease blood flow to your feet. Do not use heating pads or hot water bottles on your feet. They may burn your skin. If you have lost feeling in your feet or legs, you may not know this is happening until it is too late. Protect your feet from hot and cold by wearing shoes, such as at the beach or on hot pavement. Schedule a complete foot exam at least once a year or more often if  you have foot problems. Report any cuts, sores, or bruises to your health care provider right away. Where to find more information American Diabetes Association: diabetes.org Association of Diabetes Care & Education Specialists: diabeteseducator.org Contact a health care provider if: You have a condition that increases your risk of infection, and you have any cuts, sores, or bruises on your feet. You have an injury that is not healing. You have redness on your legs or feet. You feel burning or tingling in your legs or feet. You have pain or cramps in your legs and feet. Your legs or feet are numb. Your feet always feel cold. You have pain around any toenails. Get help right away if: You have a wound, scrape, corn, or callus on your foot and: You have signs of infection. You have a fever. You have a red line going up your leg. This information is not intended to replace advice given to you by your health care provider. Make sure you discuss any questions you have with your health care provider. Document Revised: 01/21/2022 Document Reviewed: 01/21/2022 Elsevier Patient Education  2024 [employer].

## 2023-08-24 ENCOUNTER — Encounter: Payer: Self-pay | Admitting: Nurse Practitioner

## 2023-08-24 ENCOUNTER — Ambulatory Visit (INDEPENDENT_AMBULATORY_CARE_PROVIDER_SITE_OTHER): Payer: 59 | Admitting: Nurse Practitioner

## 2023-08-24 VITALS — BP 135/78 | HR 77 | Temp 98.1°F | Ht 64.0 in | Wt 177.2 lb

## 2023-08-24 DIAGNOSIS — Z7984 Long term (current) use of oral hypoglycemic drugs: Secondary | ICD-10-CM

## 2023-08-24 DIAGNOSIS — E6609 Other obesity due to excess calories: Secondary | ICD-10-CM | POA: Diagnosis not present

## 2023-08-24 DIAGNOSIS — E1129 Type 2 diabetes mellitus with other diabetic kidney complication: Secondary | ICD-10-CM | POA: Diagnosis not present

## 2023-08-24 DIAGNOSIS — E1142 Type 2 diabetes mellitus with diabetic polyneuropathy: Secondary | ICD-10-CM

## 2023-08-24 DIAGNOSIS — E1159 Type 2 diabetes mellitus with other circulatory complications: Secondary | ICD-10-CM | POA: Diagnosis not present

## 2023-08-24 DIAGNOSIS — E785 Hyperlipidemia, unspecified: Secondary | ICD-10-CM

## 2023-08-24 DIAGNOSIS — E1169 Type 2 diabetes mellitus with other specified complication: Secondary | ICD-10-CM

## 2023-08-24 DIAGNOSIS — E66811 Obesity, class 1: Secondary | ICD-10-CM

## 2023-08-24 DIAGNOSIS — I152 Hypertension secondary to endocrine disorders: Secondary | ICD-10-CM | POA: Diagnosis not present

## 2023-08-24 DIAGNOSIS — R809 Proteinuria, unspecified: Secondary | ICD-10-CM

## 2023-08-24 DIAGNOSIS — Z6830 Body mass index (BMI) 30.0-30.9, adult: Secondary | ICD-10-CM

## 2023-08-24 DIAGNOSIS — E119 Type 2 diabetes mellitus without complications: Secondary | ICD-10-CM | POA: Diagnosis not present

## 2023-08-24 LAB — MICROALBUMIN, URINE WAIVED
Creatinine, Urine Waived: 50 mg/dL (ref 10–300)
Microalb, Ur Waived: 10 mg/L (ref 0–19)

## 2023-08-24 LAB — BAYER DCA HB A1C WAIVED: HB A1C (BAYER DCA - WAIVED): 6.6 % — ABNORMAL HIGH (ref 4.8–5.6)

## 2023-08-24 MED ORDER — HYDROCHLOROTHIAZIDE 25 MG PO TABS: 25.0000 mg | ORAL_TABLET | Freq: Every day | ORAL | 4 refills | Status: AC

## 2023-08-24 MED ORDER — AMLODIPINE BESYLATE 5 MG PO TABS: 5.0000 mg | ORAL_TABLET | Freq: Every day | ORAL | 4 refills | Status: AC

## 2023-08-24 MED ORDER — ROSUVASTATIN CALCIUM 10 MG PO TABS: 10.0000 mg | ORAL_TABLET | Freq: Every day | ORAL | 4 refills | Status: AC

## 2023-08-24 NOTE — Assessment & Plan Note (Signed)
Chronic, ongoing.  Continue current medication regimen and adjust as needed. Lipid panel today. 

## 2023-08-24 NOTE — Progress Notes (Signed)
BP 135/78   Pulse 77   Temp 98.1 F (36.7 C) (Oral)   Ht 5\' 4"  (1.626 m)   Wt 177 lb 3.2 oz (80.4 kg)   LMP  (LMP Unknown)   SpO2 98%   BMI 30.42 kg/m    Subjective:    Patient ID: Julia Wood, female    DOB: 07-12-1961, 63 y.o.   MRN: [id number]  HPI: Julia Wood is a 63 y.o. female  Chief Complaint  Patient presents with   Diabetes    No recent eye exam per patient   Hyperlipidemia   Hypertension   DIABETES A1c 7.1% October.  Taking Metformin 1000 MG in the morning and 500 MG at night (continues to miss night time dose twice a week).  Not taking Gabapentin currently, did not like it.  We discussed Ozempic as she is very frustrated about not being able to lose weight, but she declines -- due to fear of needles. Hypoglycemic episodes:no Polydipsia/polyuria: no Visual disturbance: no Chest pain: no Paresthesias: no Glucose Monitoring: yes             Accucheck frequency: every two weeks             Fasting glucose: 120 to 160 -- did have one episode of passing out at grocery store 2 weeks ago, sugar got low, had not ate             Post prandial:             Evening:             Before meals: Taking Insulin?: no             Long acting insulin:             Short acting insulin: Blood Pressure Monitoring: a few times a week Retinal Examination: Not [facility] to [facility] Foot Exam: Up to Date Pneumovax: Not up to Date -- refuses due to fear of needles Influenza: Not Up to Date -- refuses due to fear of needles Aspirin: yes    HYPERTENSION / HYPERLIPIDEMIA Continues on HCTZ, Amlodipine, and Crestor.    History of colon polyps, was scheduled for colonoscopy but insurance told her day of procedure they would not pay for it -- still not covering per patient. Satisfied with current treatment? yes Duration of hypertension: chronic BP monitoring frequency: not checking BP range:  BP medication side effects: no Duration of hyperlipidemia:  chronic Cholesterol medication side effects: no Cholesterol supplements: none Medication compliance: good compliance Aspirin: yes Recent stressors: no Recurrent headaches: no Visual changes: no Palpitations: no Dyspnea: no Chest pain: no Lower extremity edema: no Dizzy/lightheaded: no  The 10-year ASCVD risk score (Arnett DK, et al., 2019) is: 19.3%   Values used to calculate the score:     Age: 89 years     Sex: Female     Is Non-Hispanic African American: Yes     Diabetic: Yes     Tobacco smoker: No     Systolic Blood Pressure: 135 mmHg     Is BP treated: Yes     HDL Cholesterol: 29 mg/dL     Total Cholesterol: 139 mg/dL     11/09/8117    1:47 PM 05/24/2023   10:12 AM 02/19/2023   11:38 AM 11/10/2022    8:35 AM 11/28/2021   11:18 AM  Depression screen PHQ 2/9  Decreased Interest 1 1 0 1 0  Down, Depressed, Hopeless  1 0 2 2 0  PHQ - 2 Score 2 1 2 3  0  Altered sleeping 2 2 1 2 2   Tired, decreased energy 2 0 2 1 0  Change in appetite 1 0 2 0 1  Feeling bad or failure about yourself  1 0 0 0 0  Trouble concentrating 0 0 0 0 0  Moving slowly or fidgety/restless 0 0 0 0 0  Suicidal thoughts 0 0 0 0 0  PHQ-9 Score 8 3 7 6 3   Difficult doing work/chores Not difficult at all Somewhat difficult  Somewhat difficult Not difficult at all       08/24/2023    1:54 PM 05/24/2023   10:12 AM 02/19/2023   11:39 AM 11/10/2022    8:36 AM  GAD 7 : Generalized Anxiety Score  Nervous, Anxious, on Edge 0 1 0 0  Control/stop worrying 1 1 0 0  Worry too much - different things 2 0 0 0  Trouble relaxing 3 1 1 1   Restless 0 0 0 0  Easily annoyed or irritable 1 0 0 0  Afraid - awful might happen 1 0 0 1  Total GAD 7 Score 8 3 1 2   Anxiety Difficulty Not difficult at all Somewhat difficult Not difficult at all Not difficult at all   Relevant past medical, surgical, family and social history reviewed and updated as indicated. Interim medical history since our last visit reviewed. Allergies  and medications reviewed and updated.  Review of Systems  Constitutional:  Negative for activity change, appetite change, diaphoresis, fatigue and fever.  Respiratory:  Negative for cough, chest tightness and shortness of breath.   Cardiovascular:  Negative for chest pain, palpitations and leg swelling.  Gastrointestinal: Negative.   Endocrine: Negative for cold intolerance, heat intolerance, polydipsia, polyphagia and polyuria.  Neurological: Negative.   Psychiatric/Behavioral: Negative.     Per HPI unless specifically indicated above     Objective:    BP 135/78   Pulse 77   Temp 98.1 F (36.7 C) (Oral)   Ht 5\' 4"  (1.626 m)   Wt 177 lb 3.2 oz (80.4 kg)   LMP  (LMP Unknown)   SpO2 98%   BMI 30.42 kg/m   Wt Readings from Last 3 Encounters:  08/24/23 177 lb 3.2 oz (80.4 kg)  05/24/23 172 lb 6.4 oz (78.2 kg)  02/19/23 175 lb 3.2 oz (79.5 kg)    Physical Exam Vitals and nursing note reviewed.  Constitutional:      General: She is awake. She is not in acute distress.    Appearance: She is well-developed and well-groomed. She is obese. She is not ill-appearing.  HENT:     Head: Normocephalic.     Right Ear: Hearing normal.     Left Ear: Hearing normal.  Eyes:     General: Lids are normal.        Right eye: No discharge.        Left eye: No discharge.     Conjunctiva/sclera: Conjunctivae normal.     Pupils: Pupils are equal, round, and reactive to light.  Neck:     Thyroid: No thyromegaly.     Vascular: No carotid bruit.  Cardiovascular:     Rate and Rhythm: Normal rate and regular rhythm.     Heart sounds: Normal heart sounds. No murmur heard.    No gallop.  Pulmonary:     Effort: Pulmonary effort is normal. No accessory muscle usage or respiratory distress.  Breath sounds: Normal breath sounds.  Abdominal:     General: Bowel sounds are normal.     Palpations: Abdomen is soft.  Musculoskeletal:     Right shoulder: Normal.     Left shoulder: Normal.      Cervical back: Normal range of motion and neck supple.     Right lower leg: No edema.     Left lower leg: No edema.  Skin:    General: Skin is warm and dry.  Neurological:     Mental Status: She is alert and oriented to person, place, and time.  Psychiatric:        Attention and Perception: Attention normal.        Mood and Affect: Mood normal.        Behavior: Behavior normal. Behavior is cooperative.        Thought Content: Thought content normal.        Judgment: Judgment normal.    Results for orders placed or performed in visit on 05/24/23  HgB A1c   Collection Time: 05/24/23 10:58 AM  Result Value Ref Range   Hgb A1c MFr Bld 7.1 (H) 4.8 - 5.6 %   Est. average glucose Bld gHb Est-mCnc 157 mg/dL  Comprehensive metabolic panel   Collection Time: 05/24/23 10:58 AM  Result Value Ref Range   Glucose 135 (H) 70 - 99 mg/dL   BUN 13 8 - 27 mg/dL   Creatinine, Ser 0.86 0.57 - 1.00 mg/dL   eGFR 578 >46 NG/EXB/2.84   BUN/Creatinine Ratio 21 12 - 28   Sodium 142 134 - 144 mmol/L   Potassium 3.5 3.5 - 5.2 mmol/L   Chloride 98 96 - 106 mmol/L   CO2 29 20 - 29 mmol/L   Calcium 9.9 8.7 - 10.3 mg/dL   Total Protein 6.4 6.0 - 8.5 g/dL   Albumin 4.1 3.9 - 4.9 g/dL   Globulin, Total 2.3 1.5 - 4.5 g/dL   Bilirubin Total <1.3 0.0 - 1.2 mg/dL   Alkaline Phosphatase 76 44 - 121 IU/L   AST 23 0 - 40 IU/L   ALT 20 0 - 32 IU/L  Lipid Panel w/o Chol/HDL Ratio   Collection Time: 05/24/23 10:58 AM  Result Value Ref Range   Cholesterol, Total 139 100 - 199 mg/dL   Triglycerides 244 (H) 0 - 149 mg/dL   HDL 29 (L) >01 mg/dL   VLDL Cholesterol Cal 63 (H) 5 - 40 mg/dL   LDL Chol Calc (NIH) 47 0 - 99 mg/dL  TSH   Collection Time: 05/24/23 10:58 AM  Result Value Ref Range   TSH 3.880 0.450 - 4.500 uIU/mL  Vitamin B12   Collection Time: 05/24/23 10:58 AM  Result Value Ref Range   Vitamin B-12 521 232 - 1,245 pg/mL      Assessment & Plan:   Problem List Items Addressed This Visit        Cardiovascular and Mediastinum   Hypertension associated with diabetes (HCC)   Chronic, stable.  BP at goal on recheck today in office and at goal on home readings.  Urine ALB 13 August 2023 -- unable to take ACE or ARB due to history of cough.  Continue current regimen and adjust as needed, may need to increase Amlodipine to 10 MG in future if elevations present.  Recommend she monitor BP at least a few mornings a week at home and document.  DASH diet at home.  LABS: CMP, TSH.  Return in 3 months.  Relevant Medications   rosuvastatin (CRESTOR) 10 MG tablet   hydrochlorothiazide (HYDRODIURIL) 25 MG tablet   amLODipine (NORVASC) 5 MG tablet   Other Relevant Orders   Bayer DCA Hb A1c Waived   Microalbumin, Urine Waived   Comprehensive metabolic panel     Endocrine   Diabetes mellitus treated with oral medication (HCC)   Refer to diabetes with proteinuria plan of care.      Relevant Medications   rosuvastatin (CRESTOR) 10 MG tablet   Hyperlipidemia associated with type 2 diabetes mellitus (HCC)   Chronic, ongoing.  Continue current medication regimen and adjust as needed.  Lipid panel today.      Relevant Medications   rosuvastatin (CRESTOR) 10 MG tablet   hydrochlorothiazide (HYDRODIURIL) 25 MG tablet   amLODipine (NORVASC) 5 MG tablet   Other Relevant Orders   Bayer DCA Hb A1c Waived   Comprehensive metabolic panel   Lipid Panel w/o Chol/HDL Ratio   Type 2 diabetes mellitus with peripheral neuropathy (HCC)   Chronic, ongoing.  A1c 6.6%, trend down.  Urine ALB 13 August 2023, unable to take ACE or ARB due to history of cough.  Continue Metformin 1000 MG BID, however we again discussed Ozempic as she would like to lose weight.  Weight gain is a frustration for her and effects over all mood -- she will think about this and let provider know next visit.  Continue checking BS at least fasting once weekly and document. She self stopped Gabapentin, return to in future is needed.   Return in 3 months. - Can not take ACE or ARB - Statin on board - Needs to schedule eye exam - Foot exam up to date - Refuses multiple vaccinations -- fear of needles - Recommend she consistently take medications with no missed doses.      Relevant Medications   rosuvastatin (CRESTOR) 10 MG tablet   Other Relevant Orders   Bayer DCA Hb A1c Waived   Type 2 diabetes mellitus with proteinuria (HCC) - Primary   Chronic, ongoing.  A1c 6.6%, trend down.  Urine ALB 13 August 2023, unable to take ACE or ARB due to history of cough.  Continue Metformin 1000 MG BID, however we again discussed Ozempic as she would like to lose weight.  Weight gain is a frustration for her and effects over all mood -- she will think about this and let provider know next visit.  Continue checking BS at least fasting once weekly and document. She self stopped Gabapentin, return to in future is needed.  Return in 3 months. - Can not take ACE or ARB - Statin on board - Needs to schedule eye exam - Foot exam up to date - Refuses multiple vaccinations -- fear of needles - Recommend she consistently take medications with no missed doses.      Relevant Medications   rosuvastatin (CRESTOR) 10 MG tablet   Other Relevant Orders   Bayer DCA Hb A1c Waived     Other   Obesity   BMI 30.42.  Recommended eating smaller high protein, low fat meals more frequently and exercising 30 mins a day 5 times a week with a goal of 10-15lb weight loss in the next 3 months. Patient voiced their understanding and motivation to adhere to these recommendations.         Follow up plan: Return in about 3 months (around 11/22/2023) for T2DM, HTN/HLD.

## 2023-08-24 NOTE — Assessment & Plan Note (Signed)
Chronic, ongoing.  A1c 6.6%, trend down.  Urine ALB 13 August 2023, unable to take ACE or ARB due to history of cough.  Continue Metformin 1000 MG BID, however we again discussed Ozempic as she would like to lose weight.  Weight gain is a frustration for her and effects over all mood -- she will think about this and let provider know next visit.  Continue checking BS at least fasting once weekly and document. She self stopped Gabapentin, return to in future is needed.  Return in 3 months. - Can not take ACE or ARB - Statin on board - Needs to schedule eye exam - Foot exam up to date - Refuses multiple vaccinations -- fear of needles - Recommend she consistently take medications with no missed doses.

## 2023-08-24 NOTE — Assessment & Plan Note (Addendum)
Chronic, stable.  BP at goal on recheck today in office and at goal on home readings.  Urine ALB 13 August 2023 -- unable to take ACE or ARB due to history of cough.  Continue current regimen and adjust as needed, may need to increase Amlodipine to 10 MG in future if elevations present.  Recommend she monitor BP at least a few mornings a week at home and document.  DASH diet at home.  LABS: CMP, TSH.  Return in 3 months.

## 2023-08-24 NOTE — Assessment & Plan Note (Signed)
BMI 30.42.  Recommended eating smaller high protein, low fat meals more frequently and exercising 30 mins a day 5 times a week with a goal of 10-15lb weight loss in the next 3 months. Patient voiced their understanding and motivation to adhere to these recommendations.  

## 2023-08-24 NOTE — Assessment & Plan Note (Signed)
Refer to diabetes with proteinuria plan of care. 

## 2023-08-25 ENCOUNTER — Telehealth: Payer: Self-pay

## 2023-08-25 ENCOUNTER — Other Ambulatory Visit: Payer: Self-pay | Admitting: Nurse Practitioner

## 2023-08-25 DIAGNOSIS — E875 Hyperkalemia: Secondary | ICD-10-CM

## 2023-08-25 LAB — LIPID PANEL W/O CHOL/HDL RATIO
Cholesterol, Total: 120 mg/dL (ref 100–199)
HDL: 28 mg/dL — ABNORMAL LOW (ref >39)
LDL Chol Calc (NIH): 34 mg/dL (ref 0–99)
Triglycerides: 399 mg/dL — ABNORMAL HIGH (ref 0–149)
VLDL Cholesterol Cal: 58 mg/dL — ABNORMAL HIGH (ref 5–40)

## 2023-08-25 LAB — COMPREHENSIVE METABOLIC PANEL
ALT: 15 [IU]/L (ref 0–32)
AST: 17 [IU]/L (ref 0–40)
Albumin: 4.1 g/dL (ref 3.9–4.9)
Alkaline Phosphatase: 86 [IU]/L (ref 44–121)
BUN/Creatinine Ratio: 12 (ref 12–28)
BUN: 8 mg/dL (ref 8–27)
Bilirubin Total: <0.2 mg/dL (ref 0.0–1.2)
CO2: 24 mmol/L (ref 20–29)
Calcium: 9.3 mg/dL (ref 8.7–10.3)
Chloride: 101 mmol/L (ref 96–106)
Creatinine, Ser: 0.69 mg/dL (ref 0.57–1.00)
Globulin, Total: 2.1 g/dL (ref 1.5–4.5)
Glucose: 119 mg/dL — ABNORMAL HIGH (ref 70–99)
Potassium: 3.4 mmol/L — ABNORMAL LOW (ref 3.5–5.2)
Sodium: 142 mmol/L (ref 134–144)
Total Protein: 6.2 g/dL (ref 6.0–8.5)
eGFR: 97 mL/min/{1.73_m2} (ref >59)

## 2023-08-25 NOTE — Progress Notes (Signed)
Good afternoon, please let Beda know that labs have returned + needs lab only visit in 2 weeks: - Kidney function is normal, as is liver function. - Potassium level a little bit low, I recommend increase potassium rich food in diet to help increase this.  Bananas, mangoes, dried fruit, potatoes, raisin bran, orange juice, and much more.  I would like to recheck this level outpatient in 2 weeks to ensure improving. - Lipid panel shows stable LDL, bad cholesterol, but triglycerides elevated still.  Continue current medications and come fasting to next visit to recheck.  Any questions? Keep being amazing!!  Thank you for allowing me to participate in your care.  I appreciate you. Kindest regards, Amora Sheehy

## 2023-08-25 NOTE — Telephone Encounter (Signed)
Called patient to go over lab results. Okay for PEC to discuss with patient if she returns the call

## 2023-08-25 NOTE — Telephone Encounter (Signed)
-----   Message from Marjie Skiff sent at 08/25/2023  1:01 PM EST ----- Good afternoon, please let Charsie know that labs have returned + needs lab only visit in 2 weeks: - Kidney function is normal, as is liver function. - Potassium level a little bit low, I recommend increase potassium rich food in diet to help increase this.  Bananas, mangoes, dried fruit, potatoes, raisin bran, orange juice, and much more.  I would like to recheck this level outpatient in 2 weeks to ensure improving. - Lipid panel shows stable LDL, bad cholesterol, but triglycerides elevated still.  Continue current medications and come fasting to next visit to recheck.  Any questions? Keep being amazing!!  Thank you for allowing me to participate in your care.  I appreciate you. Kindest regards, Jolene

## 2023-11-24 ENCOUNTER — Ambulatory Visit: Payer: Self-pay | Admitting: Nurse Practitioner

## 2023-11-24 DIAGNOSIS — E1142 Type 2 diabetes mellitus with diabetic polyneuropathy: Secondary | ICD-10-CM

## 2023-11-24 DIAGNOSIS — E66811 Obesity, class 1: Secondary | ICD-10-CM

## 2023-11-24 DIAGNOSIS — E119 Type 2 diabetes mellitus without complications: Secondary | ICD-10-CM

## 2023-11-24 DIAGNOSIS — I152 Hypertension secondary to endocrine disorders: Secondary | ICD-10-CM

## 2023-11-24 DIAGNOSIS — E1169 Type 2 diabetes mellitus with other specified complication: Secondary | ICD-10-CM

## 2023-11-24 DIAGNOSIS — E1129 Type 2 diabetes mellitus with other diabetic kidney complication: Secondary | ICD-10-CM

## 2023-11-26 ENCOUNTER — Encounter: Payer: Self-pay | Admitting: *Deleted

## 2024-03-22 ENCOUNTER — Other Ambulatory Visit: Payer: Self-pay | Admitting: Nurse Practitioner

## 2024-03-23 NOTE — Telephone Encounter (Signed)
 Requested by interface surescripts. Future visit 03/28/24. Last OV 08/24/23. Requested Prescriptions  Pending Prescriptions Disp Refills   metFORMIN  (GLUCOPHAGE ) 1000 MG tablet [Pharmacy Med Name: metFORMIN  HCl 1000 MG Oral Tablet] 60 tablet 0    Sig: TAKE 1 TABLET BY MOUTH TWICE DAILY WITH MEALS     Endocrinology:  Diabetes - Biguanides Failed - 03/23/2024  3:48 PM      Failed - HBA1C is between 0 and 7.9 and within 180 days    HB A1C (BAYER DCA - WAIVED)  Date Value Ref Range Status  08/24/2023 6.6 (H) 4.8 - 5.6 % Final    Comment:             Prediabetes: 5.7 - 6.4          Diabetes: >6.4          Glycemic control for adults with diabetes: <7.0          Failed - Valid encounter within last 6 months    Recent Outpatient Visits   None            Failed - CBC within normal limits and completed in the last 12 months    WBC  Date Value Ref Range Status  05/11/2022 7.6 3.4 - 10.8 x10E3/uL Final   RBC  Date Value Ref Range Status  05/11/2022 4.86 3.77 - 5.28 x10E6/uL Final   Hemoglobin  Date Value Ref Range Status  05/11/2022 12.9 11.1 - 15.9 g/dL Final   Hematocrit  Date Value Ref Range Status  05/11/2022 39.4 34.0 - 46.6 % Final   MCHC  Date Value Ref Range Status  05/11/2022 32.7 31.5 - 35.7 g/dL Final   [facility]  Date Value Ref Range Status  05/11/2022 26.5 (L) 26.6 - 33.0 pg Final   MCV  Date Value Ref Range Status  05/11/2022 81 79 - 97 fL Final   No results found for: PLTCOUNTKUC, LABPLAT, POCPLA RDW  Date Value Ref Range Status  05/11/2022 13.9 11.7 - 15.4 % Final         Passed - Cr in normal range and within 360 days    Creatinine, Ser  Date Value Ref Range Status  08/24/2023 0.69 0.57 - 1.00 mg/dL Final         Passed - eGFR in normal range and within 360 days    GFR calc Af Amer  Date Value Ref Range Status  04/05/2020 86 >59 mL/min/1.73 Final    Comment:    **[facility] currently reports eGFR in compliance with the current**    recommendations of the [employer]. [facility] will   update reporting as new guidelines are published from the NKF-ASN   Task force.    GFR calc non Af Amer  Date Value Ref Range Status  04/05/2020 75 >59 mL/min/1.73 Final   eGFR  Date Value Ref Range Status  08/24/2023 97 >59 mL/min/1.73 Final         Passed - B12 Level in normal range and within 720 days    Vitamin B-12  Date Value Ref Range Status  05/24/2023 521 232 - 1,245 pg/mL Final

## 2024-03-25 NOTE — Patient Instructions (Incomplete)
 Start taking Magnesium glycinate or chloride 400 MG at night.  Can take dose in morning too.  Please call to schedule your mammogram and/or bone density: [facility] at [facility]  Address: [address], [place], [place] [address] Phone: [phone]  [facility] at [facility] [address] [place],  [place]  [address] Phone: [phone]    Be Involved in Caring For Your Health:  Taking Medications When medications are taken as directed, they can greatly improve your health. But if they are not taken as prescribed, they may not work. In some cases, not taking them correctly can be harmful. To help ensure your treatment remains effective and safe, understand your medications and how to take them. Bring your medications to each visit for review by your provider.  Your lab results, notes, and after visit summary will be available on My Chart. We strongly encourage you to use this feature. If lab results are abnormal the clinic will contact you with the appropriate steps. If the clinic does not contact you assume the results are satisfactory. You can always view your results on My Chart. If you have questions regarding your health or results, please contact the clinic during office hours. You can also ask questions on My Chart.  We at [facility] are grateful that you chose us  to provide your care. We strive to provide evidence-based and compassionate care and are always looking for feedback. If you get a survey from the clinic please complete this so we can hear your opinions.  Diabetes Mellitus and Exercise Regular exercise is important for your health, especially if you have diabetes mellitus. Exercise is not just about losing weight. It can also help you increase muscle strength and bone density and reduce body fat and stress. This can help your level of endurance and make you more fit and flexible. Why should I exercise if I  have diabetes? Exercise has many benefits for people with diabetes. It can: Help lower and control your blood sugar (glucose). Help your body respond better and become more sensitive to the hormone insulin. Reduce how much insulin your body needs. Lower your risk for heart disease by: Lowering how much bad cholesterol and triglycerides you have in your body. Increasing how much good cholesterol you have in your body. Lowering your blood pressure. Lowering your blood glucose levels. What is my activity plan? Your health care provider or an expert trained in diabetes care (certified diabetes educator) can help you make an activity plan. This plan can help you find the type of exercise that works for you. It may also tell you how often to exercise and for how long. Be sure to: Get at least 150 minutes of medium-intensity or high-intensity exercise each week. This may involve brisk walking, biking, or water aerobics. Do stretching and strengthening exercises at least 2 times a week. This may involve yoga or weight lifting. Spread out your activity over at least 3 days of the week. Get some form of physical activity each day. Do not go more than 2 days in a row without some kind of activity. Avoid being inactive for more than 30 minutes at a time. Take frequent breaks to walk or stretch. Choose activities that you enjoy. Set goals that you know you can accomplish. Start slowly and increase the intensity of your exercise over time. How do I manage my diabetes during exercise?  Monitor your blood glucose Check your blood glucose  before and after you exercise. If your blood glucose is 240 mg/dL (86.6 mmol/L) or higher before you exercise, check your urine for ketones. These are chemicals created by the liver. If you have ketones in your urine, do not exercise until your blood glucose returns to normal. If your blood glucose is 100 mg/dL (5.6 mmol/L) or lower, eat a snack that has 15-20 grams of  carbohydrate in it. Check your blood glucose 15 minutes after the snack to make sure that your level is above 100 mg/dL (5.6 mmol/L) before you start to exercise. Your risk for low blood glucose (hypoglycemia) goes up during and after exercise. Know the symptoms of this condition and how to treat it. Follow these instructions at home: Keep a carbohydrate snack on hand for use before, during, and after exercise. This can help prevent or treat hypoglycemia. Avoid injecting insulin into parts of your body that are going to be used during exercise. This may include: Your arms, when you are going to play tennis. Your legs, when you are about to go jogging. Keep track of your exercise habits. This can help you and your health care provider watch and adjust your activity plan. Write down: What you eat before and after you exercise. Blood glucose levels before and after you exercise. The type and amount of exercise you do. Talk to your health care provider before you start a new activity. They may need to: Make sure that the activity is safe for you. Adjust your insulin, other medicines, and food that you eat. Drink water while you exercise. This can stop you from losing too much water (dehydration). It can also prevent problems caused by having a lot of heat in your body (heat stroke). Where to find more information American Diabetes Association: diabetes.org Association of Diabetes Care & Education Specialists: diabeteseducator.org This information is not intended to replace advice given to you by your health care provider. Make sure you discuss any questions you have with your health care provider. Document Revised: 01/07/2022 Document Reviewed: 01/07/2022 Elsevier Patient Education  2024 [employer].

## 2024-03-28 ENCOUNTER — Ambulatory Visit: Admitting: Nurse Practitioner

## 2024-03-28 ENCOUNTER — Encounter: Payer: Self-pay | Admitting: Nurse Practitioner

## 2024-03-28 VITALS — BP 128/68 | HR 68 | Temp 98.9°F | Ht 64.0 in | Wt 172.6 lb

## 2024-03-28 DIAGNOSIS — Z7984 Long term (current) use of oral hypoglycemic drugs: Secondary | ICD-10-CM | POA: Diagnosis not present

## 2024-03-28 DIAGNOSIS — E6609 Other obesity due to excess calories: Secondary | ICD-10-CM | POA: Diagnosis not present

## 2024-03-28 DIAGNOSIS — R809 Proteinuria, unspecified: Secondary | ICD-10-CM

## 2024-03-28 DIAGNOSIS — I152 Hypertension secondary to endocrine disorders: Secondary | ICD-10-CM

## 2024-03-28 DIAGNOSIS — E66811 Obesity, class 1: Secondary | ICD-10-CM | POA: Diagnosis not present

## 2024-03-28 DIAGNOSIS — Z860100 Personal history of colon polyps, unspecified: Secondary | ICD-10-CM

## 2024-03-28 DIAGNOSIS — E785 Hyperlipidemia, unspecified: Secondary | ICD-10-CM

## 2024-03-28 DIAGNOSIS — E1159 Type 2 diabetes mellitus with other circulatory complications: Secondary | ICD-10-CM

## 2024-03-28 DIAGNOSIS — E1129 Type 2 diabetes mellitus with other diabetic kidney complication: Secondary | ICD-10-CM

## 2024-03-28 DIAGNOSIS — E1142 Type 2 diabetes mellitus with diabetic polyneuropathy: Secondary | ICD-10-CM

## 2024-03-28 DIAGNOSIS — E119 Type 2 diabetes mellitus without complications: Secondary | ICD-10-CM | POA: Diagnosis not present

## 2024-03-28 DIAGNOSIS — E1169 Type 2 diabetes mellitus with other specified complication: Secondary | ICD-10-CM | POA: Diagnosis not present

## 2024-03-28 LAB — BAYER DCA HB A1C WAIVED: HB A1C (BAYER DCA - WAIVED): 6.6 % — ABNORMAL HIGH (ref 4.8–5.6)

## 2024-03-28 MED ORDER — METFORMIN HCL 1000 MG PO TABS: 1000.0000 mg | ORAL_TABLET | Freq: Two times a day (BID) | ORAL | 3 refills | Status: AC

## 2024-03-28 NOTE — Assessment & Plan Note (Signed)
 Chronic, stable.  BP at goal on recheck today in office.  Urine ALB 13 August 2023 -- unable to take ACE or ARB due to history of cough.  Continue current regimen and adjust as needed, may need to increase Amlodipine  to 10 MG in future if elevations present.  Recommend she monitor BP at least a few mornings a week at home and document.  DASH diet at home.  LABS: CMP.

## 2024-03-28 NOTE — Assessment & Plan Note (Signed)
 Referral to GI per request.

## 2024-03-28 NOTE — Assessment & Plan Note (Signed)
 BMI 29.63, praised for weight loss.  Recommended eating smaller high protein, low fat meals more frequently and exercising 30 mins a day 5 times a week with a goal of 10-15lb weight loss in the next 3 months. Patient voiced their understanding and motivation to adhere to these recommendations.

## 2024-03-28 NOTE — Progress Notes (Signed)
 BP 128/68 (BP Location: Left Arm, Cuff Size: Normal)   Pulse 68   Temp 98.9 F (37.2 C) (Oral)   Ht 5' 4 (1.626 m)   Wt 172 lb 9.6 oz (78.3 kg)   LMP  (LMP Unknown)   SpO2 95%   BMI 29.63 kg/m    Subjective:    Patient ID: Julia Wood, female    DOB: 1961-06-09, 63 y.o.   MRN: [id number]  HPI: Julia Wood is a 63 y.o. female  Chief Complaint  Patient presents with   Diabetes    Patient states she has not had a recent eye exam    Hyperlipidemia   Hypertension   DIABETES January A1c 6.6%. Has been out of Metformin  since Saturday.  Takes morning and night, 1000 MG.  Hypoglycemic episodes:no Polydipsia/polyuria: no Visual disturbance: no Chest pain: no Paresthesias: no Glucose Monitoring: yes             Accucheck frequency: every three weeks             Fasting glucose: 130 range             Post prandial:             Evening:             Before meals: Taking Insulin?: no             Long acting insulin:             Short acting insulin: Blood Pressure Monitoring: a few times a week Retinal Examination: Not up to Date -- [facility] Foot Exam: Up to Date Pneumovax: Not up to Date -- refuses due to fear of needles Influenza: Not Up to Date -- refuses due to fear of needles Aspirin: yes    HYPERTENSION / HYPERLIPIDEMIA Taking HCTZ, Amlodipine , and Crestor .  Has not taken BP medications today.  History of colon polyps, was scheduled for colonoscopy but insurance told her day of procedure they would not pay for it -- still not covering per patient. Satisfied with current treatment? yes Duration of hypertension: chronic BP monitoring frequency: not checking BP range:  BP medication side effects: no Duration of hyperlipidemia: chronic Cholesterol medication side effects: no Cholesterol supplements: none Medication compliance: good compliance Aspirin: yes Recent stressors: no Recurrent headaches: no Visual changes: no Palpitations: no Dyspnea:  no Chest pain: no Lower extremity edema: no Dizzy/lightheaded: no  The ASCVD Risk score (Arnett DK, et al., 2019) failed to calculate for the following reasons:   The valid total cholesterol range is 130 to 320 mg/dL     1/73/7974    8:55 PM 08/24/2023    1:54 PM 05/24/2023   10:12 AM 02/19/2023   11:38 AM 11/10/2022    8:35 AM  Depression screen PHQ 2/9  Decreased Interest 0 1 1 0 1  Down, Depressed, Hopeless 0 1 0 2 2  PHQ - 2 Score 0 2 1 2 3   Altered sleeping 0 2 2 1 2   Tired, decreased energy 0 2 0 2 1  Change in appetite 0 1 0 2 0  Feeling bad or failure about yourself  0 1 0 0 0  Trouble concentrating 0 0 0 0 0  Moving slowly or fidgety/restless 0 0 0 0 0  Suicidal thoughts 0 0 0 0 0  PHQ-9 Score 0 8 3 7 6   Difficult doing work/chores Not difficult at all Not difficult at all Somewhat difficult  Somewhat difficult  03/28/2024    1:44 PM 08/24/2023    1:54 PM 05/24/2023   10:12 AM 02/19/2023   11:39 AM  GAD 7 : Generalized Anxiety Score  Nervous, Anxious, on Edge 0 0 1 0  Control/stop worrying 0 1 1 0  Worry too much - different things 0 2 0 0  Trouble relaxing 0 3 1 1   Restless 0 0 0 0  Easily annoyed or irritable 0 1 0 0  Afraid - awful might happen 0 1 0 0  Total GAD 7 Score 0 8 3 1   Anxiety Difficulty Not difficult at all Not difficult at all Somewhat difficult Not difficult at all   Relevant past medical, surgical, family and social history reviewed and updated as indicated. Interim medical history since our last visit reviewed. Allergies and medications reviewed and updated.  Review of Systems  Constitutional:  Negative for activity change, appetite change, diaphoresis, fatigue and fever.  Respiratory:  Negative for cough, chest tightness and shortness of breath.   Cardiovascular:  Negative for chest pain, palpitations and leg swelling.  Gastrointestinal: Negative.   Endocrine: Negative for cold intolerance, heat intolerance, polydipsia, polyphagia and  polyuria.  Neurological: Negative.   Psychiatric/Behavioral: Negative.     Per HPI unless specifically indicated above     Objective:    BP 128/68 (BP Location: Left Arm, Cuff Size: Normal)   Pulse 68   Temp 98.9 F (37.2 C) (Oral)   Ht 5' 4 (1.626 m)   Wt 172 lb 9.6 oz (78.3 kg)   LMP  (LMP Unknown)   SpO2 95%   BMI 29.63 kg/m   Wt Readings from Last 3 Encounters:  03/28/24 172 lb 9.6 oz (78.3 kg)  08/24/23 177 lb 3.2 oz (80.4 kg)  05/24/23 172 lb 6.4 oz (78.2 kg)    Physical Exam Vitals and nursing note reviewed.  Constitutional:      General: She is awake. She is not in acute distress.    Appearance: She is well-developed and well-groomed. She is obese. She is not ill-appearing.  HENT:     Head: Normocephalic.     Right Ear: Hearing normal.     Left Ear: Hearing normal.  Eyes:     General: Lids are normal.        Right eye: No discharge.        Left eye: No discharge.     Conjunctiva/sclera: Conjunctivae normal.     Pupils: Pupils are equal, round, and reactive to light.  Neck:     Thyroid : No thyromegaly.     Vascular: No carotid bruit.  Cardiovascular:     Rate and Rhythm: Normal rate and regular rhythm.     Heart sounds: Normal heart sounds. No murmur heard.    No gallop.  Pulmonary:     Effort: Pulmonary effort is normal. No accessory muscle usage or respiratory distress.     Breath sounds: Normal breath sounds.  Abdominal:     General: Bowel sounds are normal.     Palpations: Abdomen is soft.  Musculoskeletal:     Right shoulder: Normal.     Left shoulder: Normal.     Cervical back: Normal range of motion and neck supple.     Right lower leg: No edema.     Left lower leg: No edema.  Skin:    General: Skin is warm and dry.  Neurological:     Mental Status: She is alert and oriented to person, place, and time.  Psychiatric:        Attention and Perception: Attention normal.        Mood and Affect: Mood normal.        Behavior: Behavior normal.  Behavior is cooperative.        Thought Content: Thought content normal.        Judgment: Judgment normal.    Results for orders placed or performed in visit on 08/24/23  Bayer DCA Hb A1c Waived   Collection Time: 08/24/23  1:38 PM  Result Value Ref Range   HB A1C (BAYER DCA - WAIVED) 6.6 (H) 4.8 - 5.6 %  Microalbumin, Urine Waived   Collection Time: 08/24/23  1:38 PM  Result Value Ref Range   Microalb, Ur Waived 10 0 - 19 mg/L   Creatinine, Urine Waived 50 10 - 300 mg/dL   Microalb/Creat Ratio 30-300 (H) <30 mg/g  Comprehensive metabolic panel   Collection Time: 08/24/23  1:41 PM  Result Value Ref Range   Glucose 119 (H) 70 - 99 mg/dL   BUN 8 8 - 27 mg/dL   Creatinine, Ser 9.30 0.57 - 1.00 mg/dL   eGFR 97 >40 fO/fpw/8.26   BUN/Creatinine Ratio 12 12 - 28   Sodium 142 134 - 144 mmol/L   Potassium 3.4 (L) 3.5 - 5.2 mmol/L   Chloride 101 96 - 106 mmol/L   CO2 24 20 - 29 mmol/L   Calcium  9.3 8.7 - 10.3 mg/dL   Total Protein 6.2 6.0 - 8.5 g/dL   Albumin 4.1 3.9 - 4.9 g/dL   Globulin, Total 2.1 1.5 - 4.5 g/dL   Bilirubin Total <9.7 0.0 - 1.2 mg/dL   Alkaline Phosphatase 86 44 - 121 IU/L   AST 17 0 - 40 IU/L   ALT 15 0 - 32 IU/L  Lipid Panel w/o Chol/HDL Ratio   Collection Time: 08/24/23  1:41 PM  Result Value Ref Range   Cholesterol, Total 120 100 - 199 mg/dL   Triglycerides 600 (H) 0 - 149 mg/dL   HDL 28 (L) >60 mg/dL   VLDL Cholesterol Cal 58 (H) 5 - 40 mg/dL   LDL Chol Calc (NIH) 34 0 - 99 mg/dL      Assessment & Plan:   Problem List Items Addressed This Visit       Cardiovascular and Mediastinum   Hypertension associated with diabetes (HCC)   Chronic, stable.  BP at goal on recheck today in office.  Urine ALB 13 August 2023 -- unable to take ACE or ARB due to history of cough.  Continue current regimen and adjust as needed, may need to increase Amlodipine  to 10 MG in future if elevations present.  Recommend she monitor BP at least a few mornings a week at home and  document.  DASH diet at home.  LABS: CMP.         Relevant Medications   metFORMIN  (GLUCOPHAGE ) 1000 MG tablet   Other Relevant Orders   Bayer DCA Hb A1c Waived     Endocrine   Type 2 diabetes mellitus with proteinuria (HCC) - Primary   Chronic, ongoing.  A1c 6.6%, remaining stable.  Urine ALB 13 August 2023, unable to take ACE or ARB due to history of cough.  Continue Metformin  1000 MG BID, however we again discussed Ozempic as she would like to lose weight.  She prefers not to use needles, fearful of them. Continue checking BS at least fasting once weekly and document.  Return in 6 months. -  Cannot take ACE or ARB - Statin on board - Needs to schedule eye exam - Foot exam up to date - Refuses multiple vaccinations -- fear of needles - Recommend she consistently take medications with no missed doses.      Relevant Medications   metFORMIN  (GLUCOPHAGE ) 1000 MG tablet   Other Relevant Orders   Bayer DCA Hb A1c Waived   Type 2 diabetes mellitus with peripheral neuropathy (HCC)   Chronic, ongoing.  A1c 6.6%, remaining stable.  Urine ALB 13 August 2023, unable to take ACE or ARB due to history of cough.  Continue Metformin  1000 MG BID, however we again discussed Ozempic as she would like to lose weight.  She prefers not to use needles, fearful of them. Continue checking BS at least fasting once weekly and document.  Return in 6 months.  Does get muscle cramps at night.  We discussed magnesium, she will try this first.  Did not like Gabapentin  in past.  Could consider trial of Requip in future, discussed with her. - Cannot take ACE or ARB - Statin on board - Needs to schedule eye exam - Foot exam up to date - Refuses multiple vaccinations -- fear of needles - Recommend she consistently take medications with no missed doses.      Relevant Medications   metFORMIN  (GLUCOPHAGE ) 1000 MG tablet   Other Relevant Orders   Bayer DCA Hb A1c Waived   Hyperlipidemia associated with type 2  diabetes mellitus (HCC)   Chronic, ongoing.  Continue current medication regimen and adjust as needed.  Lipid panel today.      Relevant Medications   metFORMIN  (GLUCOPHAGE ) 1000 MG tablet   Other Relevant Orders   Bayer DCA Hb A1c Waived   Comprehensive metabolic panel with GFR   Lipid Panel w/o Chol/HDL Ratio   Diabetes mellitus treated with oral medication (HCC)   Refer to diabetes with proteinuria plan of care.      Relevant Medications   metFORMIN  (GLUCOPHAGE ) 1000 MG tablet   Other Relevant Orders   Bayer DCA Hb A1c Waived     Other   Obesity   BMI 29.63, praised for weight loss.  Recommended eating smaller high protein, low fat meals more frequently and exercising 30 mins a day 5 times a week with a goal of 10-15lb weight loss in the next 3 months. Patient voiced their understanding and motivation to adhere to these recommendations.       Relevant Medications   metFORMIN  (GLUCOPHAGE ) 1000 MG tablet   History of colonic polyps   Referral to GI per request.      Relevant Orders   Ambulatory referral to Gastroenterology     Follow up plan: Return in about 6 months (around 09/28/2024) for T2DM, HTN/HLD.

## 2024-03-28 NOTE — Assessment & Plan Note (Signed)
 Chronic, ongoing.  A1c 6.6%, remaining stable.  Urine ALB 13 August 2023, unable to take ACE or ARB due to history of cough.  Continue Metformin  1000 MG BID, however we again discussed Ozempic as she would like to lose weight.  She prefers not to use needles, fearful of them. Continue checking BS at least fasting once weekly and document.  Return in 6 months.  Does get muscle cramps at night.  We discussed magnesium, she will try this first.  Did not like Gabapentin  in past.  Could consider trial of Requip in future, discussed with her. - Cannot take ACE or ARB - Statin on board - Needs to schedule eye exam - Foot exam up to date - Refuses multiple vaccinations -- fear of needles - Recommend she consistently take medications with no missed doses.

## 2024-03-28 NOTE — Assessment & Plan Note (Signed)
 Chronic, ongoing.  A1c 6.6%, remaining stable.  Urine ALB 13 August 2023, unable to take ACE or ARB due to history of cough.  Continue Metformin  1000 MG BID, however we again discussed Ozempic as she would like to lose weight.  She prefers not to use needles, fearful of them. Continue checking BS at least fasting once weekly and document.  Return in 6 months. - Cannot take ACE or ARB - Statin on board - Needs to schedule eye exam - Foot exam up to date - Refuses multiple vaccinations -- fear of needles - Recommend she consistently take medications with no missed doses.

## 2024-03-28 NOTE — Assessment & Plan Note (Signed)
 Chronic, ongoing.  Continue current medication regimen and adjust as needed. Lipid panel today.

## 2024-03-28 NOTE — Assessment & Plan Note (Signed)
 Refer to diabetes with proteinuria plan of care.

## 2024-03-29 ENCOUNTER — Ambulatory Visit: Payer: Self-pay | Admitting: Nurse Practitioner

## 2024-03-29 LAB — LIPID PANEL W/O CHOL/HDL RATIO
Cholesterol, Total: 116 mg/dL (ref 100–199)
HDL: 32 mg/dL — ABNORMAL LOW (ref >39)
LDL Chol Calc (NIH): 62 mg/dL (ref 0–99)
Triglycerides: 119 mg/dL (ref 0–149)
VLDL Cholesterol Cal: 22 mg/dL (ref 5–40)

## 2024-03-29 LAB — COMPREHENSIVE METABOLIC PANEL WITH GFR
ALT: 19 IU/L (ref 0–32)
AST: 21 IU/L (ref 0–40)
Albumin: 3.8 g/dL — ABNORMAL LOW (ref 3.9–4.9)
Alkaline Phosphatase: 62 IU/L (ref 44–121)
BUN/Creatinine Ratio: 11 — ABNORMAL LOW (ref 12–28)
BUN: 8 mg/dL (ref 8–27)
Bilirubin Total: <0.2 mg/dL (ref 0.0–1.2)
CO2: 24 mmol/L (ref 20–29)
Calcium: 9.2 mg/dL (ref 8.7–10.3)
Chloride: 107 mmol/L — ABNORMAL HIGH (ref 96–106)
Creatinine, Ser: 0.74 mg/dL (ref 0.57–1.00)
Globulin, Total: 2.1 g/dL (ref 1.5–4.5)
Glucose: 95 mg/dL (ref 70–99)
Potassium: 4 mmol/L (ref 3.5–5.2)
Sodium: 145 mmol/L — ABNORMAL HIGH (ref 134–144)
Total Protein: 5.9 g/dL — ABNORMAL LOW (ref 6.0–8.5)
eGFR: 90 mL/min/1.73 (ref >59)

## 2024-03-29 NOTE — Progress Notes (Signed)
 Good afternoon, please let Julia Wood know her labs have returned: - Kidney and liver function are normal. - Sodium level is mildly elevated, I recommend increasing water intake and cutting back on salt intake.  We will recheck next visit. - Lipid panel remains stable.  Continue all current medications.  Any questions? Keep being awesome!!  Thank you for allowing me to participate in your care.  I appreciate you. Kindest regards, Jalyssa Fleisher

## 2024-06-07 DIAGNOSIS — Z860100 Personal history of colon polyps, unspecified: Secondary | ICD-10-CM | POA: Diagnosis not present

## 2024-06-07 DIAGNOSIS — Z01818 Encounter for other preprocedural examination: Secondary | ICD-10-CM | POA: Diagnosis not present

## 2024-06-14 ENCOUNTER — Ambulatory Visit: Admitting: Anesthesiology

## 2024-06-14 ENCOUNTER — Encounter
Admission: RE | Disposition: A | Discharge status: Home / Self Care | Payer: Self-pay | Source: Home / Self Care | Attending: Gastroenterology

## 2024-06-14 ENCOUNTER — Telehealth: Payer: Self-pay | Admitting: Nurse Practitioner

## 2024-06-14 ENCOUNTER — Encounter: Payer: Self-pay | Admitting: Gastroenterology

## 2024-06-14 ENCOUNTER — Ambulatory Visit
Admission: RE | Admit: 2024-06-14 | Discharge: 2024-06-14 | Disposition: A | Discharge status: Home / Self Care | Attending: Gastroenterology | Admitting: Gastroenterology

## 2024-06-14 DIAGNOSIS — Z1211 Encounter for screening for malignant neoplasm of colon: Secondary | ICD-10-CM | POA: Diagnosis not present

## 2024-06-14 DIAGNOSIS — E785 Hyperlipidemia, unspecified: Secondary | ICD-10-CM | POA: Insufficient documentation

## 2024-06-14 DIAGNOSIS — E119 Type 2 diabetes mellitus without complications: Secondary | ICD-10-CM | POA: Diagnosis not present

## 2024-06-14 DIAGNOSIS — Z7984 Long term (current) use of oral hypoglycemic drugs: Secondary | ICD-10-CM | POA: Insufficient documentation

## 2024-06-14 DIAGNOSIS — K635 Polyp of colon: Secondary | ICD-10-CM | POA: Diagnosis not present

## 2024-06-14 DIAGNOSIS — Z7982 Long term (current) use of aspirin: Secondary | ICD-10-CM | POA: Insufficient documentation

## 2024-06-14 DIAGNOSIS — I10 Essential (primary) hypertension: Secondary | ICD-10-CM | POA: Diagnosis not present

## 2024-06-14 DIAGNOSIS — Z860100 Personal history of colon polyps, unspecified: Secondary | ICD-10-CM | POA: Diagnosis not present

## 2024-06-14 DIAGNOSIS — D128 Benign neoplasm of rectum: Secondary | ICD-10-CM | POA: Diagnosis not present

## 2024-06-14 DIAGNOSIS — Z79899 Other long term (current) drug therapy: Secondary | ICD-10-CM | POA: Insufficient documentation

## 2024-06-14 DIAGNOSIS — D126 Benign neoplasm of colon, unspecified: Secondary | ICD-10-CM | POA: Diagnosis not present

## 2024-06-14 HISTORY — PX: POLYPECTOMY: SHX149

## 2024-06-14 HISTORY — PX: COLONOSCOPY: SHX5424

## 2024-06-14 LAB — GLUCOSE, CAPILLARY: Glucose-Capillary: 111 mg/dL — ABNORMAL HIGH (ref 70–99)

## 2024-06-14 SURGERY — COLONOSCOPY
Anesthesia: General

## 2024-06-14 MED ORDER — DEXMEDETOMIDINE HCL IN NACL 80 MCG/20ML IV SOLN
INTRAVENOUS | Status: DC | PRN
  Administered 2024-06-14: 8 ug via INTRAVENOUS
  Administered 2024-06-14: 12 ug via INTRAVENOUS

## 2024-06-14 MED ORDER — DEXMEDETOMIDINE HCL IN NACL 80 MCG/20ML IV SOLN
INTRAVENOUS | Status: AC
  Filled 2024-06-14: qty 20

## 2024-06-14 MED ORDER — ALPRAZOLAM 0.5 MG PO TABS: ORAL_TABLET | ORAL | 0 refills | Status: AC

## 2024-06-14 MED ORDER — SODIUM CHLORIDE 0.9 % IV SOLN: INTRAVENOUS | Status: DC

## 2024-06-14 MED ORDER — PROPOFOL 500 MG/50ML IV EMUL
INTRAVENOUS | Status: DC | PRN
  Administered 2024-06-14: 75 ug/kg/min via INTRAVENOUS

## 2024-06-14 MED ORDER — PROPOFOL 1000 MG/100ML IV EMUL
INTRAVENOUS | Status: AC
  Filled 2024-06-14: qty 500

## 2024-06-14 MED ORDER — LIDOCAINE HCL (CARDIAC) PF 100 MG/5ML IV SOSY
PREFILLED_SYRINGE | INTRAVENOUS | Status: DC | PRN
  Administered 2024-06-14: 60 mg via INTRAVENOUS

## 2024-06-14 MED ORDER — PROPOFOL 10 MG/ML IV BOLUS
INTRAVENOUS | Status: DC | PRN
Start: 2024-06-14 — End: 2024-06-14
  Administered 2024-06-14: 30 mg via INTRAVENOUS
  Administered 2024-06-14: 50 mg via INTRAVENOUS

## 2024-06-14 MED ORDER — LIDOCAINE HCL (PF) 2 % IJ SOLN
INTRAMUSCULAR | Status: AC
  Filled 2024-06-14: qty 20

## 2024-06-14 NOTE — Anesthesia Preprocedure Evaluation (Signed)
 Anesthesia Evaluation  Patient identified by MRN, date of birth, ID band Patient awake    Reviewed: Allergy & Precautions, NPO status , Patient's Chart, lab work & pertinent test results  Airway Mallampati: II  TM Distance: >3 FB Neck ROM: Full    Dental  (+) Partial Upper   Pulmonary neg pulmonary ROS   Pulmonary exam normal        Cardiovascular Exercise Tolerance: Good hypertension, Pt. on medications negative cardio ROS Normal cardiovascular exam Rhythm:Regular Rate:Normal     Neuro/Psych negative neurological ROS  negative psych ROS   GI/Hepatic negative GI ROS, Neg liver ROS,,,  Endo/Other  negative endocrine ROSdiabetes, Type 2, Oral Hypoglycemic Agents    Renal/GU negative Renal ROS  negative genitourinary   Musculoskeletal   Abdominal Normal abdominal exam  (+)   Peds negative pediatric ROS (+)  Hematology negative hematology ROS (+)   Anesthesia Other Findings Past Medical History: No date: Diabetes mellitus without complication (HCC) No date: Hyperlipidemia No date: Hypertension  Past Surgical History: 09/29/2018: COLONOSCOPY WITH PROPOFOL ; N/A     Comment:  Procedure: COLONOSCOPY WITH PROPOFOL ;  Surgeon:               Janalyn Keene NOVAK, MD;  Location: [facility] ENDOSCOPY;                Service: Endoscopy;  Laterality: N/A; 08/22/2020: COLONOSCOPY WITH PROPOFOL ; N/A     Comment:  Procedure: COLONOSCOPY WITH PROPOFOL ;  Surgeon:               Janalyn Keene NOVAK, MD;  Location: [facility] ENDOSCOPY;                Service: Endoscopy;  Laterality: N/A; No date: STOMACH SURGERY  BMI    Body Mass Index: 30.04 kg/m      Reproductive/Obstetrics negative OB ROS                              Anesthesia Physical Anesthesia Plan  ASA: 2  Anesthesia Plan: General   Post-op Pain Management:    Induction: Intravenous  PONV Risk Score and Plan: Propofol  infusion and  TIVA  Airway Management Planned: Natural Airway and Nasal Cannula  Additional Equipment:   Intra-op Plan:   Post-operative Plan:   Informed Consent: I have reviewed the patients History and Physical, chart, labs and discussed the procedure including the risks, benefits and alternatives for the proposed anesthesia with the patient or authorized representative who has indicated his/her understanding and acceptance.     Dental Advisory Given  Plan Discussed with: CRNA  Anesthesia Plan Comments:         Anesthesia Quick Evaluation

## 2024-06-14 NOTE — Telephone Encounter (Signed)
 Copied from CRM [id number]. Topic: Clinical - Medication Question >> Jun 14, 2024  2:31 PM Charlet HERO wrote: Reason for CRM: Patient is calling about a med to calm her nerves for her tooth pull tomorrow morning, she states that Jolene was suppose to call it in please call the patient back with further asst.

## 2024-06-14 NOTE — Addendum Note (Signed)
 Addended by: Rexann Lueras T on: 06/14/2024 05:12 PM   Modules accepted: Orders

## 2024-06-14 NOTE — Op Note (Signed)
 [facility] Gastroenterology Patient Name: Julia Wood Procedure Date: 06/14/2024 11:55 AM MRN: [id number] Account #: [id number] Date of Birth: 29-Oct-1960 Admit Type: Outpatient Age: 63 Room: [facility] ENDO ROOM 3 Gender: Female Note Status: Finalized Instrument Name: Colon Scope [id number] Procedure:             Colonoscopy Indications:           Surveillance: Personal history of adenomatous polyps                         on last colonoscopy > 3 years ago Providers:             Ruel Kung MD, MD Referring MD:          Melanie DASEN. Valerio (Referring MD) Medicines:             Monitored Anesthesia Care Complications:         No immediate complications. Procedure:             Pre-Anesthesia Assessment:                        - Prior to the procedure, a History and Physical was                         performed, and patient medications, allergies and                         sensitivities were reviewed. The patient's tolerance                         of previous anesthesia was reviewed.                        - The risks and benefits of the procedure and the                         sedation options and risks were discussed with the                         patient. All questions were answered and informed                         consent was obtained.                        - ASA Grade Assessment: II - A patient with mild                         systemic disease.                        After obtaining informed consent, the colonoscope was                         passed under direct vision. Throughout the procedure,                         the patient's blood pressure, pulse, and oxygen  saturations were monitored continuously. The                         Colonoscope was introduced through the anus and                         advanced to the the cecum, identified by the                         appendiceal orifice. The colonoscopy was performed                          with ease. The patient tolerated the procedure well.                         The quality of the bowel preparation was good. The                         ileocecal valve, appendiceal orifice, and rectum were                         photographed. Findings:      The perianal and digital rectal examinations were normal.      A 5 mm polyp was found in the rectum. The polyp was sessile. The polyp       was removed with a cold snare. Resection and retrieval were complete.      The exam was otherwise without abnormality. Impression:            - One 5 mm polyp in the rectum, removed with a cold                         snare. Resected and retrieved.                        - The examination was otherwise normal. Recommendation:        - Discharge patient to home (with escort).                        - Resume previous diet.                        - Continue present medications.                        - Await pathology results.                        - Repeat colonoscopy for surveillance based on                         pathology results.                        - couldnt safely retroflex in rectum due to short                         rectum Procedure Code(s):     --- Professional ---  54614, Colonoscopy, flexible; with removal of                         tumor(s), polyp(s), or other lesion(s) by snare                         technique Diagnosis Code(s):     --- Professional ---                        Z86.010, Personal history of colonic polyps                        D12.8, Benign neoplasm of rectum CPT copyright 2022 American Medical Association. All rights reserved. The codes documented in this report are preliminary and upon coder review may  be revised to meet current compliance requirements. Ruel Kung, MD Ruel Kung MD, MD 06/14/2024 12:24:51 PM This report has been signed electronically. Number of Addenda: 0 Note Initiated On: 06/14/2024 11:55 AM Total  Procedure Duration: 0 hours 16 minutes 8 seconds  Estimated Blood Loss:  Estimated blood loss: none.      [facility]

## 2024-06-14 NOTE — Anesthesia Postprocedure Evaluation (Signed)
 Anesthesia Post Note  Patient: Julia Wood  Procedure(s) Performed: COLONOSCOPY POLYPECTOMY, INTESTINE  Patient location during evaluation: PACU Anesthesia Type: General Level of consciousness: awake and awake and alert Pain management: satisfactory to patient Vital Signs Assessment: post-procedure vital signs reviewed and stable Respiratory status: spontaneous breathing Cardiovascular status: stable Anesthetic complications: no   No notable events documented.   Last Vitals:  Vitals:   06/14/24 1241 06/14/24 1247  BP: 119/74 126/75  Pulse: 66 67  Resp: 16 16  Temp:    SpO2: 98% 100%    Last Pain:  Vitals:   06/14/24 1241  TempSrc:   PainSc: 0-No pain                 VAN STAVEREN,Ketty Bitton

## 2024-06-14 NOTE — Transfer of Care (Signed)
 Immediate Anesthesia Transfer of Care Note  Patient: Julia Wood  Procedure(s) Performed: COLONOSCOPY POLYPECTOMY, INTESTINE  Patient Location: PACU  Anesthesia Type:General  Level of Consciousness: sedated  Airway & Oxygen Therapy: Patient Spontanous Breathing  Post-op Assessment: Report given to RN and Post -op Vital signs reviewed and stable  Post vital signs: Reviewed and stable  Last Vitals:  Vitals Value Taken Time  BP    Temp    Pulse    Resp    SpO2      Last Pain:  Vitals:   06/14/24 1054  TempSrc: Temporal         Complications: No notable events documented.

## 2024-06-15 LAB — SURGICAL PATHOLOGY

## 2024-06-15 NOTE — H&P (Signed)
 Ruel Kung , MD [address], [address], [place], [place], [address] Phone: [phone] Fax: [phone]  Primary Care Physician:  Valerio Melanie DASEN, NP   Pre-Procedure History & Physical: HPI:  Julia Wood is a 63 y.o. female is here for an colonoscopy.   Past Medical History:  Diagnosis Date   Diabetes mellitus without complication (HCC)    Hyperlipidemia    Hypertension     Past Surgical History:  Procedure Laterality Date   COLONOSCOPY N/A 06/14/2024   Procedure: COLONOSCOPY;  Surgeon: Kung Ruel, MD;  Location: [facility] ENDOSCOPY;  Service: Gastroenterology;  Laterality: N/A;  DM   COLONOSCOPY WITH PROPOFOL  N/A 09/29/2018   Procedure: COLONOSCOPY WITH PROPOFOL ;  Surgeon: Janalyn Keene NOVAK, MD;  Location: [facility] ENDOSCOPY;  Service: Endoscopy;  Laterality: N/A;   COLONOSCOPY WITH PROPOFOL  N/A 08/22/2020   Procedure: COLONOSCOPY WITH PROPOFOL ;  Surgeon: Janalyn Keene NOVAK, MD;  Location: [facility] ENDOSCOPY;  Service: Endoscopy;  Laterality: N/A;   POLYPECTOMY  06/14/2024   Procedure: POLYPECTOMY, INTESTINE;  Surgeon: Kung Ruel, MD;  Location: [facility] ENDOSCOPY;  Service: Gastroenterology;;   STOMACH SURGERY      Prior to Admission medications   Medication Sig Start Date End Date Taking? Authorizing Provider  ALPRAZolam (XANAX) 0.5 MG tablet Take one tablet by mouth 30 minutes prior to dental appointment, may repeat as needed 10 minutes before appointment to help with anxiety. 06/14/24   Cannady, Jolene T, NP  amLODipine  (NORVASC ) 5 MG tablet Take 1 tablet (5 mg total) by mouth daily. 08/24/23  Yes Cannady, Jolene T, NP  aspirin EC 81 MG tablet Take 81 mg by mouth daily.   Yes [provider]  hydrochlorothiazide  (HYDRODIURIL ) 25 MG tablet Take 1 tablet (25 mg total) by mouth daily. 08/24/23  Yes Cannady, Jolene T, NP  metFORMIN  (GLUCOPHAGE ) 1000 MG tablet Take 1 tablet (1,000 mg total) by mouth 2 (two) times daily with a meal. 03/28/24  Yes Cannady, Jolene T, NP   rosuvastatin  (CRESTOR ) 10 MG tablet Take 1 tablet (10 mg total) by mouth daily. 08/24/23  Yes Cannady, Jolene T, NP  Accu-Chek FastClix Lancets MISC USE AS DIRECTED UP TO FOUR TIMES  DAILY 08/07/21   Cannady, Jolene T, NP  ACCU-CHEK GUIDE test strip USE AS DIRECTED UP TO 4 TIMES DAILY 08/07/21   Cannady, Jolene T, NP  blood glucose meter kit and supplies KIT Dispense based on patient and insurance preference. Use up to four times daily as directed. (FOR ICD-9 250.00, 250.01). 03/24/19   Valerio Melanie T, NP    Allergies as of 06/07/2024 - Review Complete 03/28/2024  Allergen Reaction Noted   Losartan Cough 10/09/2020    Family History  Problem Relation Age of Onset   Hypertension Mother    Breast cancer Neg Hx     Social History   Socioeconomic History   Marital status: Single    Spouse name: Not on file   Number of children: Not on file   Years of education: Not on file   Highest education level: Not on file  Occupational History   Not on file  Tobacco Use   Smoking status: Never   Smokeless tobacco: Never  Vaping Use   Vaping status: Never Used  Substance and Sexual Activity   Alcohol use: Never   Drug use: Never   Sexual activity: Not Currently  Other Topics Concern   Not on file  Social History Narrative   Not on file   Social Drivers of  Health   Financial Resource Strain: Low Risk  (01/09/2021)   Overall Financial Resource Strain (CARDIA)    Difficulty of Paying Living Expenses: Not hard at all  Food Insecurity: No Food Insecurity (01/09/2021)   Hunger Vital Sign    Worried About Running Out of Food in the Last Year: Never true    Ran Out of Food in the Last Year: Never true  Transportation Needs: No Transportation Needs (01/09/2021)   PRAPARE - Administrator, Civil Service (Medical): No    Lack of Transportation (Non-Medical): No  Physical Activity: Sufficiently Active (01/09/2021)   Exercise Vital Sign    Days of Exercise per Week: 5 days    Minutes of  Exercise per Session: 50 min  Stress: No Stress Concern Present (01/09/2021)   [employer] of Occupational Health - Occupational Stress Questionnaire    Feeling of Stress : Only a little  Social Connections: Moderately Integrated (01/09/2021)   Social Connection and Isolation Panel    Frequency of Communication with Friends and Family: More than three times a week    Frequency of Social Gatherings with Friends and Family: More than three times a week    Attends Religious Services: More than 4 times per year    Active Member of [employer] or Organizations: Yes    Attends Banker Meetings: Never    Marital Status: Never married  Intimate Partner Violence: Not At Risk (01/09/2021)   Humiliation, Afraid, Rape, and Kick questionnaire    Fear of Current or Ex-Partner: No    Emotionally Abused: No    Physically Abused: No    Sexually Abused: No    Review of Systems: See HPI, otherwise negative ROS  Physical Exam: BP 126/75   Pulse 67   Temp (!) 97.4 F (36.3 C) (Temporal)   Resp 16   Ht 5' 4 (1.626 m)   Wt 79.4 kg   LMP  (LMP Unknown)   SpO2 100%   BMI 30.04 kg/m  General:   Alert,  pleasant and cooperative in NAD Head:  Normocephalic and atraumatic. Neck:  Supple; no masses or thyromegaly. Lungs:  Clear throughout to auscultation, normal respiratory effort.    Heart:  +S1, +S2, Regular rate and rhythm, No edema. Abdomen:  Soft, nontender and nondistended. Normal bowel sounds, without guarding, and without rebound.   Neurologic:  Alert and  oriented x4;  grossly normal neurologically.  Impression/Plan: Layman DELENA Pa is here for an colonoscopy to be performed for surveillance due to prior history of colon polyps   Risks, benefits, limitations, and alternatives regarding  colonoscopy have been reviewed with the patient.  Questions have been answered.  All parties agreeable.

## 2024-09-29 ENCOUNTER — Ambulatory Visit: Admitting: Nurse Practitioner
